# Patient Record
Sex: Female | Born: 1975 | Race: Black or African American | Hispanic: No | Smoking: Current some day smoker
Health system: Southern US, Community
[De-identification: ages and names within clinical notes are randomized; demographics above are authoritative.]

## PROBLEM LIST (undated history)

## (undated) DIAGNOSIS — F1010 Alcohol abuse, uncomplicated: Secondary | ICD-10-CM

## (undated) DIAGNOSIS — F4320 Adjustment disorder, unspecified: Secondary | ICD-10-CM

## (undated) DIAGNOSIS — Z789 Other specified health status: Secondary | ICD-10-CM

## (undated) DIAGNOSIS — F1994 Other psychoactive substance use, unspecified with psychoactive substance-induced mood disorder: Secondary | ICD-10-CM

## (undated) DIAGNOSIS — F410 Panic disorder [episodic paroxysmal anxiety] without agoraphobia: Secondary | ICD-10-CM

## (undated) HISTORY — PX: NO PAST SURGERIES: SHX2092

---

## 1997-05-14 ENCOUNTER — Ambulatory Visit (HOSPITAL_COMMUNITY): Admission: RE | Admit: 1997-05-14 | Discharge: 1997-05-14 | Payer: Self-pay | Admitting: *Deleted

## 1997-06-18 ENCOUNTER — Inpatient Hospital Stay (HOSPITAL_COMMUNITY): Admission: AD | Admit: 1997-06-18 | Discharge: 1997-06-18 | Payer: Self-pay | Admitting: *Deleted

## 1997-07-09 ENCOUNTER — Inpatient Hospital Stay (HOSPITAL_COMMUNITY): Admission: AD | Admit: 1997-07-09 | Discharge: 1997-07-09 | Payer: Self-pay | Admitting: Obstetrics

## 1997-07-10 ENCOUNTER — Inpatient Hospital Stay (HOSPITAL_COMMUNITY): Admission: AD | Admit: 1997-07-10 | Discharge: 1997-07-10 | Payer: Self-pay | Admitting: *Deleted

## 1997-09-19 ENCOUNTER — Inpatient Hospital Stay (HOSPITAL_COMMUNITY): Admission: AD | Admit: 1997-09-19 | Discharge: 1997-09-21 | Payer: Self-pay | Admitting: Obstetrics

## 2000-01-15 ENCOUNTER — Emergency Department (HOSPITAL_COMMUNITY): Admission: EM | Admit: 2000-01-15 | Discharge: 2000-01-15 | Payer: Self-pay | Admitting: Emergency Medicine

## 2005-09-04 ENCOUNTER — Inpatient Hospital Stay (HOSPITAL_COMMUNITY): Admission: AD | Admit: 2005-09-04 | Discharge: 2005-09-05 | Payer: Self-pay | Admitting: Obstetrics and Gynecology

## 2005-09-04 ENCOUNTER — Ambulatory Visit: Payer: Self-pay | Admitting: Certified Nurse Midwife

## 2008-08-09 ENCOUNTER — Emergency Department (HOSPITAL_COMMUNITY): Admission: EM | Admit: 2008-08-09 | Discharge: 2008-08-09 | Payer: Self-pay | Admitting: Emergency Medicine

## 2009-02-12 ENCOUNTER — Emergency Department (HOSPITAL_COMMUNITY): Admission: EM | Admit: 2009-02-12 | Discharge: 2009-02-12 | Payer: Self-pay | Admitting: Emergency Medicine

## 2016-01-13 NOTE — L&D Delivery Note (Signed)
Delivery Note At 12:40 AM a viable female was delivered via Vaginal, Spontaneous (Presentation: ROA).  APGAR: 8, 9; weight pending.   Placenta status: Delivered intact with gentle traction. 3 vessels cord:  with the following complications: None.  Cord pH: Not collected  Anesthesia:  Epidural Episiotomy: None Lacerations: None Suture Repair: N/A Est. Blood Loss (mL): 150  Mom to postpartum.  Baby to Couplet care / Skin to Skin.  Lovena NeighboursAbdoulaye Diallo, MD 12/03/2016, 12:57 AM  I confirm that I have verified the information documented in the resident's note and that I have also personally reperformed the physical exam and all medical decision making activities.  I was gloved and present for entire delivery SVD without incident No difficulty with shoulders No lacerations  Aviva SignsMarie L Rakia Frayne, CNM

## 2016-06-18 ENCOUNTER — Other Ambulatory Visit (HOSPITAL_COMMUNITY): Payer: Self-pay | Admitting: Nurse Practitioner

## 2016-06-18 DIAGNOSIS — O09522 Supervision of elderly multigravida, second trimester: Secondary | ICD-10-CM

## 2016-06-18 DIAGNOSIS — O26843 Uterine size-date discrepancy, third trimester: Secondary | ICD-10-CM

## 2016-06-18 DIAGNOSIS — Z3689 Encounter for other specified antenatal screening: Secondary | ICD-10-CM

## 2016-06-18 LAB — OB RESULTS CONSOLE ANTIBODY SCREEN: Antibody Screen: NEGATIVE

## 2016-06-18 LAB — OB RESULTS CONSOLE RPR: RPR: NONREACTIVE

## 2016-06-18 LAB — OB RESULTS CONSOLE ABO/RH: RH Type: POSITIVE

## 2016-06-18 LAB — OB RESULTS CONSOLE HEPATITIS B SURFACE ANTIGEN: HEP B S AG: NEGATIVE

## 2016-06-18 LAB — OB RESULTS CONSOLE GC/CHLAMYDIA
Chlamydia: NEGATIVE
Gonorrhea: NEGATIVE

## 2016-06-18 LAB — OB RESULTS CONSOLE RUBELLA ANTIBODY, IGM: RUBELLA: IMMUNE

## 2016-06-18 LAB — OB RESULTS CONSOLE HIV ANTIBODY (ROUTINE TESTING): HIV: NONREACTIVE

## 2016-06-29 ENCOUNTER — Encounter (HOSPITAL_COMMUNITY): Payer: Self-pay | Admitting: *Deleted

## 2016-07-02 ENCOUNTER — Encounter (HOSPITAL_COMMUNITY): Payer: Self-pay

## 2016-07-02 ENCOUNTER — Ambulatory Visit (HOSPITAL_COMMUNITY)
Admission: RE | Admit: 2016-07-02 | Discharge: 2016-07-02 | Disposition: A | Discharge status: Home / Self Care | Payer: Self-pay | Source: Ambulatory Visit | Attending: Nurse Practitioner | Admitting: Nurse Practitioner

## 2016-07-02 DIAGNOSIS — Z3A18 18 weeks gestation of pregnancy: Secondary | ICD-10-CM | POA: Insufficient documentation

## 2016-07-02 DIAGNOSIS — O26843 Uterine size-date discrepancy, third trimester: Secondary | ICD-10-CM

## 2016-07-02 DIAGNOSIS — O321XX0 Maternal care for breech presentation, not applicable or unspecified: Secondary | ICD-10-CM | POA: Insufficient documentation

## 2016-07-02 DIAGNOSIS — O350XX0 Maternal care for (suspected) central nervous system malformation in fetus, not applicable or unspecified: Secondary | ICD-10-CM | POA: Insufficient documentation

## 2016-07-02 DIAGNOSIS — Z3689 Encounter for other specified antenatal screening: Secondary | ICD-10-CM

## 2016-07-02 DIAGNOSIS — G930 Cerebral cysts: Secondary | ICD-10-CM | POA: Insufficient documentation

## 2016-07-02 DIAGNOSIS — O09522 Supervision of elderly multigravida, second trimester: Secondary | ICD-10-CM

## 2016-07-02 HISTORY — DX: Other specified health status: Z78.9

## 2016-07-02 NOTE — Addendum Note (Signed)
Encounter addended by: Levonne HubertStalter, Rainah Kirshner M on: 07/02/2016  3:48 PM<BR>    Actions taken: Imaging Exam ended

## 2016-07-29 ENCOUNTER — Other Ambulatory Visit (HOSPITAL_COMMUNITY): Payer: Self-pay

## 2016-11-05 LAB — OB RESULTS CONSOLE GBS: GBS: POSITIVE

## 2016-12-02 ENCOUNTER — Inpatient Hospital Stay (HOSPITAL_COMMUNITY)
Admission: RE | Admit: 2016-12-02 | Discharge: 2016-12-04 | DRG: 807 | Disposition: A | Discharge status: Home / Self Care | Payer: Medicaid Other | Source: Ambulatory Visit | Attending: Family Medicine | Admitting: Family Medicine

## 2016-12-02 ENCOUNTER — Inpatient Hospital Stay (HOSPITAL_COMMUNITY): Payer: Medicaid Other | Admitting: Anesthesiology

## 2016-12-02 ENCOUNTER — Encounter (HOSPITAL_COMMUNITY): Payer: Self-pay | Admitting: Anesthesiology

## 2016-12-02 ENCOUNTER — Encounter (HOSPITAL_COMMUNITY): Payer: Self-pay

## 2016-12-02 DIAGNOSIS — O09523 Supervision of elderly multigravida, third trimester: Secondary | ICD-10-CM | POA: Diagnosis present

## 2016-12-02 DIAGNOSIS — O26893 Other specified pregnancy related conditions, third trimester: Principal | ICD-10-CM | POA: Diagnosis present

## 2016-12-02 DIAGNOSIS — O99824 Streptococcus B carrier state complicating childbirth: Secondary | ICD-10-CM | POA: Diagnosis present

## 2016-12-02 DIAGNOSIS — Z3A40 40 weeks gestation of pregnancy: Secondary | ICD-10-CM | POA: Diagnosis not present

## 2016-12-02 LAB — CBC
HEMATOCRIT: 35.2 % — AB (ref 36.0–46.0)
Hemoglobin: 11.9 g/dL — ABNORMAL LOW (ref 12.0–15.0)
MCH: 33.1 pg (ref 26.0–34.0)
MCHC: 33.8 g/dL (ref 30.0–36.0)
MCV: 97.8 fL (ref 78.0–100.0)
PLATELETS: 281 10*3/uL (ref 150–400)
RBC: 3.6 MIL/uL — ABNORMAL LOW (ref 3.87–5.11)
RDW: 12.7 % (ref 11.5–15.5)
WBC: 8 10*3/uL (ref 4.0–10.5)

## 2016-12-02 LAB — TYPE AND SCREEN
ABO/RH(D): O POS
Antibody Screen: NEGATIVE

## 2016-12-02 LAB — RPR: RPR Ser Ql: NONREACTIVE

## 2016-12-02 MED ORDER — DEXTROSE 5 % IV SOLN
5.0000 10*6.[IU] | Freq: Once | INTRAVENOUS | Status: AC
  Administered 2016-12-02: 5 10*6.[IU] via INTRAVENOUS
  Filled 2016-12-02: qty 5

## 2016-12-02 MED ORDER — FLEET ENEMA 7-19 GM/118ML RE ENEM: 1.0000 | ENEMA | RECTAL | Status: DC | PRN

## 2016-12-02 MED ORDER — EPHEDRINE 5 MG/ML INJ: 10.0000 mg | INTRAVENOUS | Status: DC | PRN

## 2016-12-02 MED ORDER — MISOPROSTOL 50MCG HALF TABLET
50.0000 ug | ORAL_TABLET | ORAL | Status: DC
  Administered 2016-12-02: 50 ug via ORAL
  Filled 2016-12-02 (×2): qty 1

## 2016-12-02 MED ORDER — EPHEDRINE 5 MG/ML INJ
10.0000 mg | INTRAVENOUS | Status: DC | PRN
  Filled 2016-12-02: qty 2

## 2016-12-02 MED ORDER — OXYCODONE-ACETAMINOPHEN 5-325 MG PO TABS: 1.0000 | ORAL_TABLET | ORAL | Status: DC | PRN

## 2016-12-02 MED ORDER — ONDANSETRON HCL 4 MG/2ML IJ SOLN: 4.0000 mg | Freq: Four times a day (QID) | INTRAMUSCULAR | Status: DC | PRN

## 2016-12-02 MED ORDER — SOD CITRATE-CITRIC ACID 500-334 MG/5ML PO SOLN: 30.0000 mL | ORAL | Status: DC | PRN

## 2016-12-02 MED ORDER — LACTATED RINGERS IV SOLN: 500.0000 mL | Freq: Once | INTRAVENOUS | Status: DC

## 2016-12-02 MED ORDER — ACETAMINOPHEN 325 MG PO TABS: 650.0000 mg | ORAL_TABLET | ORAL | Status: DC | PRN

## 2016-12-02 MED ORDER — PHENYLEPHRINE 40 MCG/ML (10ML) SYRINGE FOR IV PUSH (FOR BLOOD PRESSURE SUPPORT)
80.0000 ug | PREFILLED_SYRINGE | INTRAVENOUS | Status: DC | PRN
  Filled 2016-12-02: qty 5

## 2016-12-02 MED ORDER — PHENYLEPHRINE 40 MCG/ML (10ML) SYRINGE FOR IV PUSH (FOR BLOOD PRESSURE SUPPORT): 80.0000 ug | PREFILLED_SYRINGE | INTRAVENOUS | Status: DC | PRN

## 2016-12-02 MED ORDER — PHENYLEPHRINE 40 MCG/ML (10ML) SYRINGE FOR IV PUSH (FOR BLOOD PRESSURE SUPPORT)
80.0000 ug | PREFILLED_SYRINGE | INTRAVENOUS | Status: DC | PRN
  Administered 2016-12-02 (×2): 80 ug via INTRAVENOUS
  Filled 2016-12-02: qty 5

## 2016-12-02 MED ORDER — FENTANYL 2.5 MCG/ML BUPIVACAINE 1/10 % EPIDURAL INFUSION (WH - ANES)
INTRAMUSCULAR | Status: AC
  Filled 2016-12-02: qty 100

## 2016-12-02 MED ORDER — OXYTOCIN 40 UNITS IN LACTATED RINGERS INFUSION - SIMPLE MED
1.0000 m[IU]/min | INTRAVENOUS | Status: DC
  Administered 2016-12-02: 1 m[IU]/min via INTRAVENOUS

## 2016-12-02 MED ORDER — LIDOCAINE HCL (PF) 1 % IJ SOLN
30.0000 mL | INTRAMUSCULAR | Status: DC | PRN
  Filled 2016-12-02: qty 30

## 2016-12-02 MED ORDER — PHENYLEPHRINE 40 MCG/ML (10ML) SYRINGE FOR IV PUSH (FOR BLOOD PRESSURE SUPPORT)
PREFILLED_SYRINGE | INTRAVENOUS | Status: AC
  Administered 2016-12-02: 80 ug via INTRAVENOUS
  Filled 2016-12-02: qty 20

## 2016-12-02 MED ORDER — OXYTOCIN BOLUS FROM INFUSION
500.0000 mL | Freq: Once | INTRAVENOUS | Status: AC
  Administered 2016-12-03: 500 mL via INTRAVENOUS

## 2016-12-02 MED ORDER — TERBUTALINE SULFATE 1 MG/ML IJ SOLN
0.2500 mg | Freq: Once | INTRAMUSCULAR | Status: DC | PRN
  Filled 2016-12-02: qty 1

## 2016-12-02 MED ORDER — LIDOCAINE HCL (PF) 1 % IJ SOLN
INTRAMUSCULAR | Status: DC | PRN
  Administered 2016-12-02 (×2): 5 mL via EPIDURAL

## 2016-12-02 MED ORDER — LACTATED RINGERS IV SOLN
500.0000 mL | Freq: Once | INTRAVENOUS | Status: AC
  Administered 2016-12-02: 500 mL via INTRAVENOUS

## 2016-12-02 MED ORDER — OXYTOCIN 40 UNITS IN LACTATED RINGERS INFUSION - SIMPLE MED
1.0000 m[IU]/min | INTRAVENOUS | Status: DC
  Filled 2016-12-02: qty 1000

## 2016-12-02 MED ORDER — FENTANYL CITRATE (PF) 100 MCG/2ML IJ SOLN
100.0000 ug | INTRAMUSCULAR | Status: DC | PRN
  Administered 2016-12-02: 100 ug via INTRAVENOUS
  Filled 2016-12-02: qty 2

## 2016-12-02 MED ORDER — OXYTOCIN 40 UNITS IN LACTATED RINGERS INFUSION - SIMPLE MED
2.5000 [IU]/h | INTRAVENOUS | Status: DC
  Administered 2016-12-03: 2.5 [IU]/h via INTRAVENOUS

## 2016-12-02 MED ORDER — HYDROXYZINE HCL 50 MG PO TABS
50.0000 mg | ORAL_TABLET | Freq: Four times a day (QID) | ORAL | Status: DC | PRN
  Filled 2016-12-02: qty 1

## 2016-12-02 MED ORDER — DIPHENHYDRAMINE HCL 50 MG/ML IJ SOLN: 12.5000 mg | INTRAMUSCULAR | Status: DC | PRN

## 2016-12-02 MED ORDER — LACTATED RINGERS IV SOLN
500.0000 mL | INTRAVENOUS | Status: DC | PRN
  Administered 2016-12-02 (×2): 500 mL via INTRAVENOUS

## 2016-12-02 MED ORDER — FENTANYL CITRATE (PF) 100 MCG/2ML IJ SOLN: 50.0000 ug | INTRAMUSCULAR | Status: DC | PRN

## 2016-12-02 MED ORDER — PENICILLIN G POT IN DEXTROSE 60000 UNIT/ML IV SOLN
3.0000 10*6.[IU] | INTRAVENOUS | Status: DC
  Administered 2016-12-02 – 2016-12-03 (×4): 3 10*6.[IU] via INTRAVENOUS
  Filled 2016-12-02 (×7): qty 50

## 2016-12-02 MED ORDER — OXYCODONE-ACETAMINOPHEN 5-325 MG PO TABS: 2.0000 | ORAL_TABLET | ORAL | Status: DC | PRN

## 2016-12-02 MED ORDER — FENTANYL 2.5 MCG/ML BUPIVACAINE 1/10 % EPIDURAL INFUSION (WH - ANES)
14.0000 mL/h | INTRAMUSCULAR | Status: DC | PRN
  Administered 2016-12-02 (×2): 14 mL/h via EPIDURAL

## 2016-12-02 MED ORDER — LACTATED RINGERS IV SOLN
INTRAVENOUS | Status: DC
  Administered 2016-12-02 (×4): via INTRAVENOUS

## 2016-12-02 NOTE — Progress Notes (Signed)
Patient ID: Veronica Le, female   DOB: 09/10/1975, 41 y.o.   MRN: [id number] Vitals:   12/02/16 1958 12/02/16 2001 12/02/16 2031 12/02/16 2101  BP: 112/64 107/72 116/88 (!) 115/50  Pulse: 73 95  81  Resp:      Temp: (!) 97.2 F (36.2 C)     TempSrc: Oral     SpO2:      Weight:      Height:       FHR stable with average variability and small early decels UCs every 2 min On Pitocin at 631mu/min  Dilation: 6.5 Effacement (%): 70 Cervical Position: Posterior, Middle Station: -2 Presentation: Vertex Exam by:: Sandy SalaamLynnsey Johnson, RN   Anticipate SVD

## 2016-12-02 NOTE — Anesthesia Preprocedure Evaluation (Signed)

## 2016-12-02 NOTE — H&P (Signed)
LABOR AND DELIVERY ADMISSION HISTORY AND PHYSICAL NOTE  Veronica Le is a 41 y.o. female [phone] with IUP at 6958w1d by 20wk US presenting for IOL for AMA.   She reports positive fetal movement. She denies leakage of fluid or vaginal bleeding.  Prenatal History/Complications:  Past Medical History: Past Medical History:  Diagnosis Date  . Medical history non-contributory     Past Surgical History: Past Surgical History:  Procedure Laterality Date  . NO PAST SURGERIES      Obstetrical History: OB History    Gravida Para Term Preterm AB Living   9 3 3   5 3    SAB TAB Ectopic Multiple Live Births     5            Social History: Social History   Socioeconomic History  . Marital status: Married    Spouse name: None  . Number of children: None  . Years of education: None  . Highest education level: None  Social Needs  . Financial resource strain: None  . Food insecurity - worry: None  . Food insecurity - inability: None  . Transportation needs - medical: None  . Transportation needs - non-medical: None  Occupational History  . None  Tobacco Use  . Smoking status: Never Smoker  . Smokeless tobacco: Never Used  Substance and Sexual Activity  . Alcohol use: No  . Drug use: No  . Sexual activity: Yes    Birth control/protection: None  Other Topics Concern  . None  Social History Narrative  . None    Family History: No family history on file.  Allergies: No Known Allergies  Medications Prior to Admission  Medication Sig Dispense Refill Last Dose  . Prenatal Vit w/Fe-Methylfol-FA (PNV PO) Take by mouth.   Taking     Review of Systems   All systems reviewed and negative except as stated in HPI  Last menstrual period 03/09/2016. General appearance: alert, cooperative and no distress Lungs: clear to auscultation bilaterally Heart: regular rate and rhythm Abdomen: soft, non-tender; bowel sounds normal Extremities: No calf swelling or  tenderness Presentation: cephalic Dilation: 1 Effacement (%): 50 Cervical Position: Posterior Station: -3 Presentation: Vertex Exam by:: Dr. Adrian BlackwaterStinson  Fetal monitoring: 130s, + accels, mod variability Uterine activity: irregular     Prenatal labs: ABO, Rh:  O+ Antibody:  neg Rubella:  immune RPR:   neg HBsAg:   neg HIV:   nonreactive GBS:   positive 1 hr Glucola: normal Genetic screening:  declined Anatomy US: normal  Prenatal Transfer Tool  Maternal Diabetes: No Genetic Screening: Declined Maternal Ultrasounds/Referrals: Normal Fetal Ultrasounds or other Referrals:  None Maternal Substance Abuse:  No Significant Maternal Medications:  None Significant Maternal Lab Results: Lab values include: Group B Strep positive  No results found for this or any previous visit (from the past 24 hour(s)).  There are no active problems to display for this patient.   Assessment: Veronica Le is a 41 y.o. [id number] at 5658w1d here for IOL for AMA  #Labor: IOL with cytotec PO. Foley balloon when able. #Pain: fentanyl #ID:  PCN #MOF: breast #MOC:POP #Circ:  N/A  Levie HeritageJacob J Raphael Espe, DO  12/02/2016, 7:44 AM

## 2016-12-02 NOTE — Anesthesia Procedure Notes (Addendum)
Epidural Patient location during procedure: OB Start time: 12/02/2016 6:52 PM End time: 12/02/2016 6:57 PM  Staffing Anesthesiologist: Bethena Midgetddono, Shaquel Josephson, MD  Preanesthetic Checklist Completed: patient identified, site marked, surgical consent, pre-op evaluation, timeout performed, IV checked, risks and benefits discussed and monitors and equipment checked  Epidural Patient position: sitting Prep: site prepped and draped and DuraPrep Patient monitoring: continuous pulse ox and blood pressure Approach: midline Location: L4-L5 Injection technique: LOR air  Needle:  Needle type: Tuohy  Needle gauge: 17 G Needle length: 9 cm and 9 Needle insertion depth: 5 cm cm Catheter type: closed end flexible Catheter size: 19 Gauge Catheter at skin depth: 10 cm Test dose: negative  Assessment Events: blood not aspirated, injection not painful, no injection resistance, negative IV test and no paresthesia

## 2016-12-02 NOTE — Progress Notes (Signed)
LABOR PROGRESS NOTE  Veronica Le is a 41 y.o. [id number] at 3335w1d  admitted for IOL for AMA.  Subjective: Patient doing well. Denies any concerns.  Objective: BP 125/67   Pulse 82   Temp 98.3 F (36.8 C) (Oral)   Resp 18   Ht 5\' 6"  (1.676 m)   Wt 218 lb (98.9 kg)   LMP 03/09/2016 (Approximate)   BMI 35.19 kg/m  or  Vitals:   12/02/16 0743 12/02/16 0930 12/02/16 1030 12/02/16 1210  BP: 131/71   125/67  Pulse: 88   82  Resp: 18 18 16 18   Temp: 99 F (37.2 C)   98.3 F (36.8 C)  TempSrc: Oral   Oral  Weight: 218 lb (98.9 kg)     Height: 5\' 6"  (1.676 m)       SVE: Dilation: 2.5 Effacement (%): 30 Cervical Position: Posterior Station: -3 Presentation: Vertex Exam by:: Dr. Nira Retortegele FHT: baseline rate 145, moderate varibility, +acel, occasional variable decels Toco: ctx q2-4 min  Assessment / Plan: 41 y.o. [id number] at 8835w1d here for IOL for AMA  Labor: s/p cytotec x 1, now contracting and with occasional variables. FB placed. Will start Pitocin 1 x 1 Fetal Wellbeing:  Cat II, continue to monitor closely Pain Control:  Supportive. Not planning on epdiural Anticipated MOD:  SVD  Frederik PearJulie P Jaben Benegas, MD 12/02/2016, 1:13 PM

## 2016-12-02 NOTE — Anesthesia Pain Management Evaluation Note (Signed)
  CRNA Pain Management Visit Note  Patient: Veronica Le, 41 y.o., female  "Hello I am a [facility] the anesthesia [facility]. We have an anesthesia team available at all times to provide care throughout the hospital, including epidural management and anesthesia for C-section. I don't know your plan for the delivery whether it a natural birth, water birth, IV sedation, nitrous supplementation, doula or epidural, but we want to meet your pain goals."   1.Was your pain managed to your expectations on prior hospitalizations?   Yes   2.What is your expectation for pain management during this hospitalization?     Labor support without medications  3.How can we help you reach that goal? Be available;pt states she doesn't want intervention for pain and has had natural labor and delivery in the past with satisfaction  Record the patient's initial score and the patient's pain goal.   Pain: 0  Pain Goal: 10 [facility] wants you to be able to say your pain was always managed very well.  Edison PaceWILKERSON,Naphtali Zywicki 12/02/2016

## 2016-12-02 NOTE — Progress Notes (Signed)
LABOR PROGRESS NOTE  Veronica Le is a 41 y.o. [id number] at 8476w1d  admitted for IOL for AMA.  Subjective: Patient feeling strong contractions now. Does not want anything for pain.  Per RN, SROM at 1800 with clear fluid.  Objective: BP 132/62   Pulse 78   Temp 98.4 F (36.9 C) (Oral)   Resp 18   Ht 5\' 6"  (1.676 m)   Wt 218 lb (98.9 kg)   LMP 03/09/2016 (Approximate)   BMI 35.19 kg/m  or  Vitals:   12/02/16 1509 12/02/16 1550 12/02/16 1640 12/02/16 1800  BP:  125/74  132/62  Pulse:  81  78  Resp: 18 20 18    Temp:  (!) 97.5 F (36.4 C)  98.4 F (36.9 C)  TempSrc:  Oral  Oral  Weight:      Height:        Last SVE: Dilation: 2.5 Effacement (%): 30 Cervical Position: Posterior Station: -3 Presentation: Vertex Exam by:: Dr. Nira Retortegele   FB remains in place  FHT: baseline rate 140, moderate varibility, +acel, occasional variable decels Toco: ctx q2-4 min  Assessment / Plan: 41 y.o. Veronica Le at 2176w1d here for IOL for AMA  Labor: s/p cytotec x 1, and FB (placed at 1300). On 451mU/min of Pitocin contracting regularly. Continue current management for now Fetal Wellbeing:  Cat II with occasional variable, bu mod variability and +acel Pain Control:  Supportive. Not planning on epdiural Anticipated MOD:  SVD  Frederik PearJulie P Calyn Rubi, MD 12/02/2016, 6:18 PM

## 2016-12-03 ENCOUNTER — Encounter (HOSPITAL_COMMUNITY): Payer: Self-pay

## 2016-12-03 DIAGNOSIS — Z3A40 40 weeks gestation of pregnancy: Secondary | ICD-10-CM

## 2016-12-03 MED ORDER — DIPHENHYDRAMINE HCL 25 MG PO CAPS
25.0000 mg | ORAL_CAPSULE | Freq: Four times a day (QID) | ORAL | Status: DC | PRN
Start: 2016-12-03 — End: 2016-12-04

## 2016-12-03 MED ORDER — TETANUS-DIPHTH-ACELL PERTUSSIS 5-2.5-18.5 LF-MCG/0.5 IM SUSP: 0.5000 mL | Freq: Once | INTRAMUSCULAR | Status: DC

## 2016-12-03 MED ORDER — ONDANSETRON HCL 4 MG PO TABS: 4.0000 mg | ORAL_TABLET | ORAL | Status: DC | PRN

## 2016-12-03 MED ORDER — WITCH HAZEL-GLYCERIN EX PADS: 1.0000 "application " | MEDICATED_PAD | CUTANEOUS | Status: DC | PRN

## 2016-12-03 MED ORDER — BENZOCAINE-MENTHOL 20-0.5 % EX AERO: 1.0000 "application " | INHALATION_SPRAY | CUTANEOUS | Status: DC | PRN

## 2016-12-03 MED ORDER — ZOLPIDEM TARTRATE 5 MG PO TABS: 5.0000 mg | ORAL_TABLET | Freq: Every evening | ORAL | Status: DC | PRN

## 2016-12-03 MED ORDER — SIMETHICONE 80 MG PO CHEW: 80.0000 mg | CHEWABLE_TABLET | ORAL | Status: DC | PRN

## 2016-12-03 MED ORDER — ONDANSETRON HCL 4 MG/2ML IJ SOLN: 4.0000 mg | INTRAMUSCULAR | Status: DC | PRN

## 2016-12-03 MED ORDER — ACETAMINOPHEN 325 MG PO TABS: 650.0000 mg | ORAL_TABLET | ORAL | Status: DC | PRN

## 2016-12-03 MED ORDER — COCONUT OIL OIL: 1.0000 "application " | TOPICAL_OIL | Status: DC | PRN

## 2016-12-03 MED ORDER — DIBUCAINE 1 % RE OINT: 1.0000 "application " | TOPICAL_OINTMENT | RECTAL | Status: DC | PRN

## 2016-12-03 MED ORDER — PRENATAL MULTIVITAMIN CH
1.0000 | ORAL_TABLET | Freq: Every day | ORAL | Status: DC
  Administered 2016-12-03 – 2016-12-04 (×2): 1 via ORAL
  Filled 2016-12-03 (×2): qty 1

## 2016-12-03 MED ORDER — IBUPROFEN 600 MG PO TABS
600.0000 mg | ORAL_TABLET | Freq: Four times a day (QID) | ORAL | Status: DC
  Administered 2016-12-03 – 2016-12-04 (×6): 600 mg via ORAL
  Filled 2016-12-03 (×6): qty 1

## 2016-12-03 MED ORDER — SENNOSIDES-DOCUSATE SODIUM 8.6-50 MG PO TABS
2.0000 | ORAL_TABLET | ORAL | Status: DC
  Administered 2016-12-03: 2 via ORAL
  Filled 2016-12-03: qty 2

## 2016-12-03 NOTE — Lactation Note (Signed)
This note was copied from a baby's chart. Lactation Consultation Note  Patient Name: Girl Chrisa Rogstad Today's Date: 12/03/2016 Reason for consult: Follow-up assessment;Difficult latch  Baby 15 hours old. Lupita Leashonna, RN in the room assessing baby's respiratory rate and temperature, and had assisted mom with hand expressing and latching the baby. Baby latched deeply but sleepy at breast and nursing off-and-on. Mom touching and stimulating baby to continue suckling. Enc mom to offer lots of STS and breast with cues. Mom reports that she nursed her older children 1 year each.   Maternal Data Has patient been taught Hand Expression?: Yes  Feeding Feeding Type: Breast Fed Length of feed: 10 min(per mom )  LATCH Score                   Interventions Interventions: Breast feeding basics reviewed  Lactation Tools Discussed/Used     Consult Status Consult Status: Follow-up Date: 12/04/16 Follow-up type: In-patient    Sherlyn HayJennifer D Donovon Micheletti 12/03/2016, 4:28 PM

## 2016-12-03 NOTE — Lactation Note (Signed)
This note was copied from a baby's chart. Lactation Consultation Note  Patient Name: Veronica Le Today's Date: 12/03/2016 Reason for consult: Initial assessment;Term(mom eating lunch and permom fed at 1:30 for 10 mins , LC enc to call for feeding assessment with cues )  Baby is 13 hours old  LC reviewed doc flow sheets - WNL for age and updated per mom  Per mom baby recently fed at 1:30 for 10 mins  LC encouraged mom to call with feeding cues for latch assessment  Mother informed of post-discharge support and given phone number to the lactation department, including services for phone call assistance; out-patient appointments; and breastfeeding support group. List of other breastfeeding resources in the community given in the handout. Encouraged mother to call for problems or concerns related to breastfeeding.   Maternal Data    Feeding Feeding Type: Breast Fed Length of feed: 10 min(per mom )  LATCH Score                   Interventions Interventions: Breast feeding basics reviewed  Lactation Tools Discussed/Used     Consult Status Consult Status: Follow-up Date: 12/03/16 Follow-up type: In-patient    Matilde SprangMargaret Ann Tasia Liz 12/03/2016, 2:07 PM

## 2016-12-03 NOTE — Lactation Note (Signed)
This note was copied from a baby's chart. Lactation Consultation Note  Patient Name: Girl Emalie Kovatch Today's Date: 12/03/2016 Reason for consult: Follow-up assessment  Baby 17 hours old. Lupita Leashonna, RN [facility] d/t mom being set up with DEBP. Mom had baby latched to right breast in cross-cradle position and baby suckling rhythmically with a few swallows noted. Mom reports that she did not see much colostrum when she pumped, but she did hand express a few drops into the baby's mouth. Enc mom to keep pumping every 2-3 hours since baby suckling off-and-on. Discussed pumping for stimulation and hand expressing to obtain colostrum to give directly to baby.   Maternal Data Has patient been taught Hand Expression?: Yes  Feeding Feeding Type: Breast Fed  LATCH Score Latch: Grasps breast easily, tongue down, lips flanged, rhythmical sucking.  Audible Swallowing: A few with stimulation  Type of Nipple: Everted at rest and after stimulation  Comfort (Breast/Nipple): Soft / non-tender  Hold (Positioning): No assistance needed to correctly position infant at breast.  LATCH Score: 9  Interventions    Lactation Tools Discussed/Used Tools: Pump Breast pump type: Double-Electric Breast Pump Pump Review: Setup, frequency, and cleaning Initiated by:: Milagros Reaponna Esker, RN Date initiated:: 12/03/16   Consult Status Consult Status: Follow-up Date: 12/04/16 Follow-up type: In-patient    Sherlyn HayJennifer D Kloey Cazarez 12/03/2016, 6:06 PM

## 2016-12-03 NOTE — Anesthesia Postprocedure Evaluation (Signed)
Anesthesia Post Note  Patient: Veronica Le  Procedure(s) Performed: AN AD HOC LABOR EPIDURAL     Patient location during evaluation: Mother Baby Anesthesia Type: Epidural Level of consciousness: awake Pain management: satisfactory to patient Vital Signs Assessment: post-procedure vital signs reviewed and stable Respiratory status: spontaneous breathing Cardiovascular status: stable Anesthetic complications: no    Last Vitals:  Vitals:   12/03/16 0153 12/03/16 0220  BP:  (!) 122/57  Pulse:  89  Resp:  18  Temp: 37.2 C 37.7 C  SpO2:      Last Pain:  Vitals:   12/03/16 0220  TempSrc: Oral  PainSc: 0-No pain   Pain Goal:                 KeyCorpBURGER,Veronica Le

## 2016-12-04 NOTE — Discharge Summary (Signed)
[facility]  Patient name: Veronica Le Medical record number: [id number] Date of birth: 02/27/1975 Age: 41 y.o. Gender: female Date of Admission: 12/02/2016  Date of Discharge: 12/04/16 Admitting Physician: Willodean Rosenthalarolyn Harraway-Smith, MD  Primary Care Provider: Patient, No Pcp Per Consultants: OB  Indication for Hospitalization: SVD delivery  Discharge Diagnoses/Problem List:  Advanced maternal age SVC  Disposition: to home  Discharge Condition: stable  Discharge Exam: well appearing Card: RRR, no significant edema Pulm: CTAB, no wheezing, no IWB Fundus: firm, minimally tender  Brief Hospital Course:  Admitted for SVD with no significant complications.   Recovered appropriately and D/c's on PPD#1  Issues for Follow Up:  1. Post partum f/u with primary obgyn in 2wks  Significant Procedures: SVD  Significant Labs and Imaging:  Recent Labs  Lab 12/02/16 0824  WBC 8.0  HGB 11.9*  HCT 35.2*  PLT 281    No results found.  Results/Tests Pending at Time of Discharge: n/a  Discharge Medications:  Allergies as of 12/04/2016   No Known Allergies     Medication List    TAKE these medications   PNV PO Take 1 tablet by mouth daily.       Discharge Instructions: Please refer to Patient Instructions section of EMR for full details.  Patient was counseled important signs and symptoms that should prompt return to medical care, changes in medications, dietary instructions, activity restrictions, and follow up appointments.   Follow-Up Appointments:   Marthenia RollingBland, Emil Klassen, DO 12/04/2016, 12:13 PM PGY-1, [facility]

## 2016-12-04 NOTE — Lactation Note (Signed)
This note was copied from a baby's chart. Lactation Consultation Note: Mother reports that her nipples are very sore and that she feels painful latch with each feeding. Infant was just fed for 25 mins. Less than an hour ago.  Assist mother with placing infant on the breast in cross cradle hold. Mother taught to latch with an off sided latch. Infant took a few sucks and then pushed off. Infant was placed in football hold and refused breast.  Lots of teaching with mother. Mother plans to use soy formula when at home to let father feed infant. Advised mother to post pump after each feeding for 15 mins to stimulate milk volume. Mother is able to hand express colostrum well.  Observed nipples and do not see any cracking. Mother advised to get coconut oil from staff nurse to use as needed. Encouraged mother to rotate positions frequently and use good breast support with hand and pillows. Mother advised in treatment and prevention of engorgment. Mother reports that she has been certified with [employer]IC in the past but doesn't have an appt scheduled at this time. [facility]. Mother has a hand pump and was advised to use as needed. Discussed cue base feeding and encouraged mother to breastfeed infant 8-12 times in 24 hours. Mother is aware of available LC services and community support.   Patient Name: Veronica Le Reason for consult: Follow-up assessment   Maternal Data    Feeding Feeding Type: Breast Fed Length of feed: 25 min  LATCH Score                   Interventions    Lactation Tools Discussed/Used     Consult Status Consult Status: Complete    Veronica BickersKendrick, Veronica Le Veronica Le, 10:19 AM

## 2017-08-02 ENCOUNTER — Encounter (HOSPITAL_COMMUNITY): Payer: Self-pay | Admitting: Emergency Medicine

## 2017-08-02 ENCOUNTER — Emergency Department (HOSPITAL_COMMUNITY)
Admission: EM | Admit: 2017-08-02 | Discharge: 2017-08-02 | Disposition: A | Discharge status: Home / Self Care | Payer: Self-pay | Attending: Emergency Medicine | Admitting: Emergency Medicine

## 2017-08-02 ENCOUNTER — Other Ambulatory Visit: Payer: Self-pay

## 2017-08-02 DIAGNOSIS — R110 Nausea: Secondary | ICD-10-CM | POA: Insufficient documentation

## 2017-08-02 DIAGNOSIS — K0889 Other specified disorders of teeth and supporting structures: Secondary | ICD-10-CM | POA: Insufficient documentation

## 2017-08-02 MED ORDER — PENICILLIN V POTASSIUM 500 MG PO TABS: 500.0000 mg | ORAL_TABLET | Freq: Four times a day (QID) | ORAL | 0 refills | Status: AC

## 2017-08-02 MED ORDER — BENZOCAINE 10 % MT GEL: 1.0000 "application " | OROMUCOSAL | 0 refills | Status: DC | PRN

## 2017-08-02 NOTE — ED Triage Notes (Signed)
Pt states she has had off and on dental pain with no fever for a while now. Feels like she has taken all of the ibuprofen and tylenol she can with no relief. Pt endorses nausea.

## 2017-08-02 NOTE — ED Provider Notes (Signed)
[facility]ENC[facility] Provider Note   CSN: [id number] Arrival date & time: 08/02/17  1946     History   Chief Complaint Chief Complaint  Patient presents with  . Dental Pain    HPI Danyah Elwell is a 42 y.o. female.  Leshia Mcconaha is a 42 y.o. Female who is otherwise healthy, presents to the ED for evaluation of dental pain.  She reports for the past 3 days she has had worsening pain over the right upper molars where she has a 4 broken teeth, she has had issues with these teeth in the past, but is never gotten this bad.  She denies any fevers.  She reports she is been taking a lot of ibuprofen and Tylenol without relief has not tried anything else to treat her symptoms.  She has had some mild nausea with no episodes of vomiting.  She reports her face feels a little bit swollen and puffy but she has had no difficulty swallowing or breathing, no pain or swelling under the tongue.     Past Medical History:  Diagnosis Date  . Medical history non-contributory     Patient Active Problem List   Diagnosis Date Noted  . AMA (advanced maternal age) multigravida 35+, third trimester 12/02/2016    Past Surgical History:  Procedure Laterality Date  . NO PAST SURGERIES       OB History    Gravida  9   Para  4   Term  4   Preterm      AB  5   Living  4     SAB      TAB  5   Ectopic      Multiple  0   Live Births  1            Home Medications    Prior to Admission medications   Medication Sig Start Date End Date Taking? Authorizing Provider  Prenatal Vit w/Fe-Methylfol-FA (PNV PO) Take 1 tablet by mouth daily.     [provider]    Family History No family history on file.  Social History Social History   Tobacco Use  . Smoking status: Never Smoker  . Smokeless tobacco: Never Used  Substance Use Topics  . Alcohol use: No  . Drug use: No     Allergies   Patient has no known allergies.   Review of  Systems Review of Systems  Constitutional: Negative for chills and fever.  HENT: Positive for dental problem. Negative for facial swelling, sore throat and trouble swallowing.   Respiratory: Negative for shortness of breath and stridor.   Gastrointestinal: Negative for nausea and vomiting.  Skin: Negative for color change and rash.     Physical Exam Updated Vital Signs BP 123/86 (BP Location: Right Arm)   Pulse 77   Temp 98.6 F (37 C) (Oral)   Resp 16   SpO2 100%   Physical Exam  Constitutional: She appears well-developed and well-nourished. No distress.  HENT:  Head: Normocephalic and atraumatic.  Teeth in general are in poor dentition, there are several broken and decayed teeth on the right upper molars, where pain is located, no obvious drainable abscess.  No sublingual tenderness or swelling, posterior oropharynx is clear and moist, uvula midline.  No trismus or torticollis  Eyes: Right eye exhibits no discharge. Left eye exhibits no discharge.  Neck: Normal range of motion. Neck supple.  No stridor  Pulmonary/Chest: Effort normal. No respiratory distress.  Neurological: She is alert. Coordination normal.  Skin: Skin is warm and dry. She is not diaphoretic.  Psychiatric: She has a normal mood and affect. Her behavior is normal.  Nursing note and vitals reviewed.    ED Treatments / Results  Labs (all labs ordered are listed, but only abnormal results are displayed) Labs Reviewed - No data to display  EKG None  Radiology No results found.  Procedures Procedures (including critical care time)  Medications Ordered in ED Medications - No data to display   Initial Impression / Assessment and Plan / ED Course  I have reviewed the triage vital signs and the nursing notes.  Pertinent labs & imaging results that were available during my care of the patient were reviewed by me and considered in my medical decision making (see chart for details).  Patient with  toothache.  No gross abscess.  Exam unconcerning for Ludwig's angina or spread of infection.  Will treat with penicillin and anti-inflammatories medicine.  Urged patient to follow-up with dentist. Dental resources provided.  Final Clinical Impressions(s) / ED Diagnoses   Final diagnoses:  Pain, dental    ED Discharge Orders        Ordered    penicillin v potassium (VEETID) 500 MG tablet  4 times daily     08/02/17 2122    benzocaine (ORAJEL) 10 % mucosal gel  As needed     08/02/17 2122       Dartha Lodge, PA-C 08/03/17 0042    Wynetta Fines, MD 08/04/17 [phone]

## 2017-08-02 NOTE — Discharge Instructions (Signed)
Please take entire course of antibiotics as directed.  You may use Orajel as needed for pain in addition to ibuprofen 600 mg every 6 hours, and Tylenol 650 mg every 6 hours.  You will need to follow-up with a dentist for further management.  Please use the dental resources provided.  Return to the emergency department for fevers, pain or swelling under the tongue, difficulty swallowing or breathing or any other new or concerning symptoms.

## 2019-11-01 ENCOUNTER — Emergency Department (HOSPITAL_COMMUNITY): Payer: Self-pay

## 2019-11-01 ENCOUNTER — Observation Stay (HOSPITAL_COMMUNITY)
Admission: EM | Admit: 2019-11-01 | Discharge: 2019-11-03 | Disposition: A | Discharge status: Home / Self Care | Payer: Self-pay | Attending: Orthopedic Surgery | Admitting: Orthopedic Surgery

## 2019-11-01 ENCOUNTER — Emergency Department (HOSPITAL_COMMUNITY): Payer: Self-pay | Admitting: Certified Registered Nurse Anesthetist

## 2019-11-01 ENCOUNTER — Encounter (HOSPITAL_COMMUNITY)
Admission: EM | Disposition: A | Discharge status: Home / Self Care | Payer: Self-pay | Source: Home / Self Care | Attending: Emergency Medicine

## 2019-11-01 ENCOUNTER — Observation Stay (HOSPITAL_COMMUNITY): Payer: Self-pay

## 2019-11-01 ENCOUNTER — Encounter (HOSPITAL_COMMUNITY): Payer: Self-pay | Admitting: Emergency Medicine

## 2019-11-01 DIAGNOSIS — Z419 Encounter for procedure for purposes other than remedying health state, unspecified: Secondary | ICD-10-CM

## 2019-11-01 DIAGNOSIS — W010XXA Fall on same level from slipping, tripping and stumbling without subsequent striking against object, initial encounter: Secondary | ICD-10-CM | POA: Insufficient documentation

## 2019-11-01 DIAGNOSIS — Z20822 Contact with and (suspected) exposure to covid-19: Secondary | ICD-10-CM | POA: Insufficient documentation

## 2019-11-01 DIAGNOSIS — S82842A Displaced bimalleolar fracture of left lower leg, initial encounter for closed fracture: Principal | ICD-10-CM | POA: Insufficient documentation

## 2019-11-01 DIAGNOSIS — Y9301 Activity, walking, marching and hiking: Secondary | ICD-10-CM | POA: Insufficient documentation

## 2019-11-01 DIAGNOSIS — S82852A Displaced trimalleolar fracture of left lower leg, initial encounter for closed fracture: Secondary | ICD-10-CM

## 2019-11-01 DIAGNOSIS — S82892A Other fracture of left lower leg, initial encounter for closed fracture: Secondary | ICD-10-CM | POA: Diagnosis present

## 2019-11-01 HISTORY — PX: ORIF ANKLE FRACTURE: SHX5408

## 2019-11-01 LAB — CBC WITH DIFFERENTIAL/PLATELET
Abs Immature Granulocytes: 0.01 10*3/uL (ref 0.00–0.07)
Basophils Absolute: 0 10*3/uL (ref 0.0–0.1)
Basophils Relative: 1 %
Eosinophils Absolute: 0 10*3/uL (ref 0.0–0.5)
Eosinophils Relative: 0 %
HCT: 39.5 % (ref 36.0–46.0)
Hemoglobin: 12.9 g/dL (ref 12.0–15.0)
Immature Granulocytes: 0 %
Lymphocytes Relative: 30 %
Lymphs Abs: 1.9 10*3/uL (ref 0.7–4.0)
MCH: 34 pg (ref 26.0–34.0)
MCHC: 32.7 g/dL (ref 30.0–36.0)
MCV: 104.2 fL — ABNORMAL HIGH (ref 80.0–100.0)
Monocytes Absolute: 0.3 10*3/uL (ref 0.1–1.0)
Monocytes Relative: 5 %
Neutro Abs: 4.1 10*3/uL (ref 1.7–7.7)
Neutrophils Relative %: 64 %
Platelets: 275 10*3/uL (ref 150–400)
RBC: 3.79 MIL/uL — ABNORMAL LOW (ref 3.87–5.11)
RDW: 11.9 % (ref 11.5–15.5)
WBC: 6.4 10*3/uL (ref 4.0–10.5)
nRBC: 0 % (ref 0.0–0.2)

## 2019-11-01 LAB — RAPID URINE DRUG SCREEN, HOSP PERFORMED
Amphetamines: NOT DETECTED
Barbiturates: NOT DETECTED
Benzodiazepines: NOT DETECTED
Cocaine: NOT DETECTED
Opiates: POSITIVE — AB
Tetrahydrocannabinol: POSITIVE — AB

## 2019-11-01 LAB — URINALYSIS, ROUTINE W REFLEX MICROSCOPIC
Bilirubin Urine: NEGATIVE
Glucose, UA: NEGATIVE mg/dL
Hgb urine dipstick: NEGATIVE
Ketones, ur: 20 mg/dL — AB
Leukocytes,Ua: NEGATIVE
Nitrite: NEGATIVE
Protein, ur: NEGATIVE mg/dL
Specific Gravity, Urine: 1.018 (ref 1.005–1.030)
pH: 5 (ref 5.0–8.0)

## 2019-11-01 LAB — COMPREHENSIVE METABOLIC PANEL
ALT: 12 U/L (ref 0–44)
AST: 22 U/L (ref 15–41)
Albumin: 4.4 g/dL (ref 3.5–5.0)
Alkaline Phosphatase: 52 U/L (ref 38–126)
Anion gap: 16 — ABNORMAL HIGH (ref 5–15)
BUN: 14 mg/dL (ref 6–20)
CO2: 20 mmol/L — ABNORMAL LOW (ref 22–32)
Calcium: 8.4 mg/dL — ABNORMAL LOW (ref 8.9–10.3)
Chloride: 106 mmol/L (ref 98–111)
Creatinine, Ser: 0.65 mg/dL (ref 0.44–1.00)
GFR, Estimated: >60 mL/min (ref >60)
Glucose, Bld: 76 mg/dL (ref 70–99)
Potassium: 3.7 mmol/L (ref 3.5–5.1)
Sodium: 142 mmol/L (ref 135–145)
Total Bilirubin: 0.3 mg/dL (ref 0.3–1.2)
Total Protein: 7.3 g/dL (ref 6.5–8.1)

## 2019-11-01 LAB — TYPE AND SCREEN
ABO/RH(D): O POS
Antibody Screen: NEGATIVE

## 2019-11-01 LAB — PROTIME-INR
INR: 1 (ref 0.8–1.2)
Prothrombin Time: 12.3 seconds (ref 11.4–15.2)

## 2019-11-01 LAB — POCT PREGNANCY, URINE
Preg Test, Ur: NEGATIVE
Preg Test, Ur: NEGATIVE

## 2019-11-01 LAB — ETHANOL: Alcohol, Ethyl (B): 266 mg/dL — ABNORMAL HIGH (ref <10)

## 2019-11-01 LAB — RESPIRATORY PANEL BY RT PCR (FLU A&B, COVID)
Influenza A by PCR: NEGATIVE
Influenza B by PCR: NEGATIVE
SARS Coronavirus 2 by RT PCR: NEGATIVE

## 2019-11-01 SURGERY — OPEN REDUCTION INTERNAL FIXATION (ORIF) ANKLE FRACTURE
Anesthesia: General | Site: Ankle | Laterality: Left

## 2019-11-01 MED ORDER — MORPHINE SULFATE (PF) 4 MG/ML IV SOLN
INTRAVENOUS | Status: AC
  Filled 2019-11-01: qty 2

## 2019-11-01 MED ORDER — 0.9 % SODIUM CHLORIDE (POUR BTL) OPTIME
TOPICAL | Status: DC | PRN
  Administered 2019-11-01: 1000 mL

## 2019-11-01 MED ORDER — HYDROMORPHONE HCL 1 MG/ML IJ SOLN
0.2500 mg | INTRAMUSCULAR | Status: DC | PRN
  Administered 2019-11-01 (×2): 0.5 mg via INTRAVENOUS

## 2019-11-01 MED ORDER — HYDROMORPHONE HCL 1 MG/ML IJ SOLN
1.0000 mg | Freq: Once | INTRAMUSCULAR | Status: AC
  Administered 2019-11-01: 1 mg via INTRAVENOUS
  Filled 2019-11-01: qty 1

## 2019-11-01 MED ORDER — MIDAZOLAM HCL 5 MG/5ML IJ SOLN
INTRAMUSCULAR | Status: DC | PRN
  Administered 2019-11-01: 2 mg via INTRAVENOUS

## 2019-11-01 MED ORDER — METHOCARBAMOL 500 MG PO TABS
500.0000 mg | ORAL_TABLET | Freq: Four times a day (QID) | ORAL | Status: DC | PRN
  Administered 2019-11-02 – 2019-11-03 (×2): 500 mg via ORAL
  Filled 2019-11-01 (×2): qty 1

## 2019-11-01 MED ORDER — SUCCINYLCHOLINE CHLORIDE 200 MG/10ML IV SOSY
PREFILLED_SYRINGE | INTRAVENOUS | Status: DC | PRN
  Administered 2019-11-01: 120 mg via INTRAVENOUS

## 2019-11-01 MED ORDER — DEXMEDETOMIDINE (PRECEDEX) IN NS 20 MCG/5ML (4 MCG/ML) IV SYRINGE
PREFILLED_SYRINGE | INTRAVENOUS | Status: AC
  Filled 2019-11-01: qty 5

## 2019-11-01 MED ORDER — CEFAZOLIN SODIUM-DEXTROSE 2-4 GM/100ML-% IV SOLN
2.0000 g | INTRAVENOUS | Status: AC
  Administered 2019-11-01: 2 g via INTRAVENOUS
  Filled 2019-11-01: qty 100

## 2019-11-01 MED ORDER — CHLORHEXIDINE GLUCONATE 0.12 % MT SOLN
15.0000 mL | Freq: Once | OROMUCOSAL | Status: AC
  Administered 2019-11-01: 15 mL via OROMUCOSAL

## 2019-11-01 MED ORDER — FENTANYL CITRATE (PF) 250 MCG/5ML IJ SOLN
INTRAMUSCULAR | Status: AC
  Filled 2019-11-01: qty 5

## 2019-11-01 MED ORDER — ACETAMINOPHEN 500 MG PO TABS
1000.0000 mg | ORAL_TABLET | Freq: Four times a day (QID) | ORAL | Status: AC
  Administered 2019-11-02 (×4): 1000 mg via ORAL
  Filled 2019-11-01 (×4): qty 2

## 2019-11-01 MED ORDER — DOCUSATE SODIUM 100 MG PO CAPS
100.0000 mg | ORAL_CAPSULE | Freq: Two times a day (BID) | ORAL | Status: DC
  Administered 2019-11-02 – 2019-11-03 (×3): 100 mg via ORAL
  Filled 2019-11-01 (×3): qty 1

## 2019-11-01 MED ORDER — VANCOMYCIN HCL 1000 MG IV SOLR
INTRAVENOUS | Status: AC
  Filled 2019-11-01: qty 1000

## 2019-11-01 MED ORDER — LACTATED RINGERS IV SOLN: INTRAVENOUS | Status: DC

## 2019-11-01 MED ORDER — ETOMIDATE 2 MG/ML IV SOLN: 12.0000 mg | Freq: Once | INTRAVENOUS | Status: DC

## 2019-11-01 MED ORDER — CLONIDINE HCL (ANALGESIA) 100 MCG/ML EP SOLN
EPIDURAL | Status: AC
  Filled 2019-11-01: qty 10

## 2019-11-01 MED ORDER — ACETAMINOPHEN 10 MG/ML IV SOLN
INTRAVENOUS | Status: AC
  Filled 2019-11-01: qty 100

## 2019-11-01 MED ORDER — LIDOCAINE 2% (20 MG/ML) 5 ML SYRINGE
INTRAMUSCULAR | Status: DC | PRN
  Administered 2019-11-01: 50 mg via INTRAVENOUS

## 2019-11-01 MED ORDER — DEXAMETHASONE SODIUM PHOSPHATE 10 MG/ML IJ SOLN
INTRAMUSCULAR | Status: DC | PRN
  Administered 2019-11-01: 10 mg via INTRAVENOUS

## 2019-11-01 MED ORDER — VANCOMYCIN HCL 1000 MG IV SOLR
INTRAVENOUS | Status: DC | PRN
  Administered 2019-11-01: 1000 mg via TOPICAL

## 2019-11-01 MED ORDER — CELECOXIB 200 MG PO CAPS
200.0000 mg | ORAL_CAPSULE | Freq: Two times a day (BID) | ORAL | Status: DC
  Administered 2019-11-01 – 2019-11-03 (×4): 200 mg via ORAL
  Filled 2019-11-01 (×4): qty 1

## 2019-11-01 MED ORDER — BUPIVACAINE HCL (PF) 0.25 % IJ SOLN
INTRAMUSCULAR | Status: DC | PRN
  Administered 2019-11-01: 30 mL

## 2019-11-01 MED ORDER — OXYCODONE HCL 5 MG PO TABS
5.0000 mg | ORAL_TABLET | ORAL | Status: DC | PRN
  Administered 2019-11-02 (×2): 5 mg via ORAL
  Administered 2019-11-02: 10 mg via ORAL
  Administered 2019-11-03 (×3): 5 mg via ORAL
  Filled 2019-11-01 (×3): qty 1
  Filled 2019-11-01: qty 2
  Filled 2019-11-01 (×2): qty 1
  Filled 2019-11-01: qty 2

## 2019-11-01 MED ORDER — PROPOFOL 10 MG/ML IV BOLUS
INTRAVENOUS | Status: DC | PRN
  Administered 2019-11-01: 170 mg via INTRAVENOUS

## 2019-11-01 MED ORDER — DEXMEDETOMIDINE (PRECEDEX) IN NS 20 MCG/5ML (4 MCG/ML) IV SYRINGE
PREFILLED_SYRINGE | INTRAVENOUS | Status: DC | PRN
  Administered 2019-11-01 (×3): 8 ug via INTRAVENOUS

## 2019-11-01 MED ORDER — METOCLOPRAMIDE HCL 5 MG/ML IJ SOLN: 5.0000 mg | Freq: Three times a day (TID) | INTRAMUSCULAR | Status: DC | PRN

## 2019-11-01 MED ORDER — ORAL CARE MOUTH RINSE: 15.0000 mL | Freq: Once | OROMUCOSAL | Status: AC

## 2019-11-01 MED ORDER — METHOCARBAMOL 1000 MG/10ML IJ SOLN
500.0000 mg | Freq: Four times a day (QID) | INTRAVENOUS | Status: DC | PRN
  Filled 2019-11-01: qty 5

## 2019-11-01 MED ORDER — STERILE WATER FOR IRRIGATION IR SOLN
Status: DC | PRN
  Administered 2019-11-01: 500 mL

## 2019-11-01 MED ORDER — METOCLOPRAMIDE HCL 5 MG PO TABS: 5.0000 mg | ORAL_TABLET | Freq: Three times a day (TID) | ORAL | Status: DC | PRN

## 2019-11-01 MED ORDER — CEFAZOLIN SODIUM-DEXTROSE 2-4 GM/100ML-% IV SOLN
2.0000 g | Freq: Three times a day (TID) | INTRAVENOUS | Status: AC
  Administered 2019-11-02 (×3): 2 g via INTRAVENOUS
  Filled 2019-11-01 (×3): qty 100

## 2019-11-01 MED ORDER — MORPHINE SULFATE (PF) 4 MG/ML IV SOLN
4.0000 mg | Freq: Once | INTRAVENOUS | Status: AC
  Administered 2019-11-01: 4 mg via INTRAVENOUS
  Filled 2019-11-01: qty 1

## 2019-11-01 MED ORDER — CHLORHEXIDINE GLUCONATE 4 % EX LIQD
60.0000 mL | Freq: Once | CUTANEOUS | Status: DC
  Filled 2019-11-01: qty 60

## 2019-11-01 MED ORDER — HYDROMORPHONE HCL 1 MG/ML IJ SOLN: 0.5000 mg | INTRAMUSCULAR | Status: DC | PRN

## 2019-11-01 MED ORDER — POVIDONE-IODINE 10 % EX SWAB: 2.0000 "application " | Freq: Once | CUTANEOUS | Status: DC

## 2019-11-01 MED ORDER — LACTATED RINGERS IV SOLN: INTRAVENOUS | Status: AC

## 2019-11-01 MED ORDER — CLONIDINE HCL (ANALGESIA) 100 MCG/ML EP SOLN
EPIDURAL | Status: DC | PRN
  Administered 2019-11-01: 100 ug

## 2019-11-01 MED ORDER — ONDANSETRON HCL 4 MG/2ML IJ SOLN
INTRAMUSCULAR | Status: DC | PRN
  Administered 2019-11-01: 4 mg via INTRAVENOUS

## 2019-11-01 MED ORDER — MORPHINE SULFATE (PF) 4 MG/ML IV SOLN
INTRAVENOUS | Status: DC | PRN
Start: 2019-11-01 — End: 2019-11-01
  Administered 2019-11-01 (×2): 4 mg via SUBCUTANEOUS

## 2019-11-01 MED ORDER — ONDANSETRON HCL 4 MG PO TABS: 4.0000 mg | ORAL_TABLET | Freq: Four times a day (QID) | ORAL | Status: DC | PRN

## 2019-11-01 MED ORDER — BUPIVACAINE HCL (PF) 0.25 % IJ SOLN
INTRAMUSCULAR | Status: AC
  Filled 2019-11-01: qty 30

## 2019-11-01 MED ORDER — CHLORHEXIDINE GLUCONATE 0.12 % MT SOLN
15.0000 mL | OROMUCOSAL | Status: AC
  Filled 2019-11-01 (×3): qty 15

## 2019-11-01 MED ORDER — ONDANSETRON HCL 4 MG/2ML IJ SOLN: 4.0000 mg | Freq: Four times a day (QID) | INTRAMUSCULAR | Status: DC | PRN

## 2019-11-01 MED ORDER — FENTANYL CITRATE (PF) 250 MCG/5ML IJ SOLN
INTRAMUSCULAR | Status: DC | PRN
  Administered 2019-11-01 (×2): 50 ug via INTRAVENOUS
  Administered 2019-11-01: 100 ug via INTRAVENOUS
  Administered 2019-11-01: 50 ug via INTRAVENOUS

## 2019-11-01 MED ORDER — HYDROMORPHONE HCL 1 MG/ML IJ SOLN
INTRAMUSCULAR | Status: AC
  Filled 2019-11-01: qty 1

## 2019-11-01 MED ORDER — MIDAZOLAM HCL 2 MG/2ML IJ SOLN
INTRAMUSCULAR | Status: AC
  Filled 2019-11-01: qty 2

## 2019-11-01 MED ORDER — ASPIRIN 81 MG PO CHEW
81.0000 mg | CHEWABLE_TABLET | Freq: Two times a day (BID) | ORAL | Status: DC
  Administered 2019-11-02 – 2019-11-03 (×4): 81 mg via ORAL
  Filled 2019-11-01 (×4): qty 1

## 2019-11-01 SURGICAL SUPPLY — 96 items
BIT DRILL 2.7 QC CANN 155 (BIT) ×1 IMPLANT
BIT DRILL 2.7 QC CANN 155MM (BIT) ×1
BIT DRILL OVR 3.5AO QC SHRT SM (DRILL) IMPLANT
BIT DRILL QC 2.0 SHORT EVOS SM (DRILL) IMPLANT
BIT DRILL QC 2.5MM SHRT EVO SM (DRILL) IMPLANT
BLADE SURG 10 STRL SS (BLADE) IMPLANT
BNDG CMPR 9X4 STRL LF SNTH (GAUZE/BANDAGES/DRESSINGS)
BNDG CMPR MED 10X6 ELC LF (GAUZE/BANDAGES/DRESSINGS) ×1
BNDG COHESIVE 6X5 TAN STRL LF (GAUZE/BANDAGES/DRESSINGS) IMPLANT
BNDG ELASTIC 4X5.8 VLCR STR LF (GAUZE/BANDAGES/DRESSINGS) ×3 IMPLANT
BNDG ELASTIC 6X10 VLCR STRL LF (GAUZE/BANDAGES/DRESSINGS) ×3 IMPLANT
BNDG ESMARK 4X9 LF (GAUZE/BANDAGES/DRESSINGS) ×1 IMPLANT
BNDG GAUZE ELAST 4 BULKY (GAUZE/BANDAGES/DRESSINGS) ×1 IMPLANT
COVER MAYO STAND STRL (DRAPES) ×2 IMPLANT
COVER SURGICAL LIGHT HANDLE (MISCELLANEOUS) ×3 IMPLANT
COVER WAND RF STERILE (DRAPES) ×1 IMPLANT
CUFF TOURN SGL QUICK 34 (TOURNIQUET CUFF)
CUFF TOURN SGL QUICK 42 (TOURNIQUET CUFF) IMPLANT
CUFF TRNQT CYL 34X4.125X (TOURNIQUET CUFF) IMPLANT
DRAPE C-ARM 42X72 X-RAY (DRAPES) ×3 IMPLANT
DRAPE INCISE IOBAN 66X45 STRL (DRAPES) ×2 IMPLANT
DRAPE SURG 17X23 STRL (DRAPES) ×1 IMPLANT
DRAPE U-SHAPE 47X51 STRL (DRAPES) ×3 IMPLANT
DRILL OVER 3.5 AO QC SHORT SM (DRILL) ×3
DRILL QC 2.0 SHORT EVOS SM (DRILL) ×3
DRILL QC 2.5MM SHORT EVOS SM (DRILL) ×3
DRSG AQUACEL AG ADV 3.5X 6 (GAUZE/BANDAGES/DRESSINGS) ×2 IMPLANT
DRSG PAD ABDOMINAL 8X10 ST (GAUZE/BANDAGES/DRESSINGS) ×3 IMPLANT
DURAPREP 26ML APPLICATOR (WOUND CARE) ×2 IMPLANT
ELECT CAUTERY BLADE 6.4 (BLADE) ×3 IMPLANT
ELECT REM PT RETURN 9FT ADLT (ELECTROSURGICAL) ×3
ELECTRODE REM PT RTRN 9FT ADLT (ELECTROSURGICAL) ×1 IMPLANT
GAUZE SPONGE 4X4 12PLY STRL (GAUZE/BANDAGES/DRESSINGS) ×1 IMPLANT
GAUZE XEROFORM 5X9 LF (GAUZE/BANDAGES/DRESSINGS) ×1 IMPLANT
GLOVE BIOGEL PI IND STRL 7.0 (GLOVE) ×1 IMPLANT
GLOVE BIOGEL PI IND STRL 8 (GLOVE) ×1 IMPLANT
GLOVE BIOGEL PI INDICATOR 7.0 (GLOVE) ×2
GLOVE BIOGEL PI INDICATOR 8 (GLOVE) ×4
GLOVE ECLIPSE 7.0 STRL STRAW (GLOVE) ×3 IMPLANT
GLOVE ECLIPSE 8.0 STRL XLNG CF (GLOVE) ×3 IMPLANT
GOWN STRL REUS W/ TWL LRG LVL3 (GOWN DISPOSABLE) ×3 IMPLANT
GOWN STRL REUS W/ TWL XL LVL3 (GOWN DISPOSABLE) IMPLANT
GOWN STRL REUS W/TWL LRG LVL3 (GOWN DISPOSABLE) ×9
GOWN STRL REUS W/TWL XL LVL3 (GOWN DISPOSABLE) ×3
HANDPIECE INTERPULSE COAX TIP (DISPOSABLE)
K-WIRE 1.3X40 (WIRE) ×6
KIT BASIN OR (CUSTOM PROCEDURE TRAY) ×3 IMPLANT
KIT TURNOVER KIT B (KITS) ×3 IMPLANT
KWIRE 1.3X40 (WIRE) IMPLANT
MANIFOLD NEPTUNE II (INSTRUMENTS) ×3 IMPLANT
NDL HYPO 25GX1X1/2 BEV (NEEDLE) ×1 IMPLANT
NEEDLE 22X1 1/2 (OR ONLY) (NEEDLE) ×2 IMPLANT
NEEDLE HYPO 25GX1X1/2 BEV (NEEDLE) ×3 IMPLANT
NS IRRIG 1000ML POUR BTL (IV SOLUTION) ×3 IMPLANT
PACK ORTHO EXTREMITY (CUSTOM PROCEDURE TRAY) ×3 IMPLANT
PAD ABD 8X10 STRL (GAUZE/BANDAGES/DRESSINGS) ×10 IMPLANT
PAD ARMBOARD 7.5X6 YLW CONV (MISCELLANEOUS) ×4 IMPLANT
PAD CAST 4YDX4 CTTN HI CHSV (CAST SUPPLIES) ×2 IMPLANT
PADDING CAST ABS 4INX4YD NS (CAST SUPPLIES) ×2
PADDING CAST ABS 6INX4YD NS (CAST SUPPLIES) ×2
PADDING CAST ABS COTTON 4X4 ST (CAST SUPPLIES) IMPLANT
PADDING CAST ABS COTTON 6X4 NS (CAST SUPPLIES) IMPLANT
PADDING CAST COTTON 4X4 STRL (CAST SUPPLIES)
PLATE 5H L 81MM FIBULA EVOS (Plate) ×2 IMPLANT
SCREW CANNULATED 4.1X40 (Screw) ×4 IMPLANT
SCREW CORT 2.7X10 STAR T8 EVOS (Screw) ×2 IMPLANT
SCREW CORT 2.7X12 EVOS (Screw) IMPLANT
SCREW CORT 2.7X15 T8 ST EVOS (Screw) ×4 IMPLANT
SCREW CORT 2.7X16 STAR T8 EVOS (Screw) ×2 IMPLANT
SCREW CORT 3.5X11 ST EVOS (Screw) ×2 IMPLANT
SCREW CORT EVOS ST 3.5X12 (Screw) ×2 IMPLANT
SCREW CORT EVOS ST T8 2.7X14MM (Screw) ×3 IMPLANT
SCREW CORTEX 2.7X15 (Screw) ×2 IMPLANT
SCREW CORTEX 3.5X18 EVOS (Screw) ×2 IMPLANT
SCREW EVOS 2.7X11 LOCK T8 (Screw) ×2 IMPLANT
SCREW LOCK ST EVOS 3.5X13 (Screw) ×2 IMPLANT
SET HNDPC FAN SPRY TIP SCT (DISPOSABLE) IMPLANT
STOCKINETTE IMPERVIOUS 9X36 MD (GAUZE/BANDAGES/DRESSINGS) IMPLANT
SUCTION FRAZIER HANDLE 10FR (MISCELLANEOUS) ×3
SUCTION TUBE FRAZIER 10FR DISP (MISCELLANEOUS) ×1 IMPLANT
SUT ETHILON 3 0 PS 1 (SUTURE) ×12 IMPLANT
SUT MNCRL AB 3-0 PS2 18 (SUTURE) ×2 IMPLANT
SUT VIC AB 2-0 CT1 27 (SUTURE) ×6
SUT VIC AB 2-0 CT1 36 (SUTURE) ×4 IMPLANT
SUT VIC AB 2-0 CT1 TAPERPNT 27 (SUTURE) IMPLANT
SUT VIC AB 2-0 CTB1 (SUTURE) ×3 IMPLANT
SUT VIC AB 3-0 SH 27 (SUTURE)
SUT VIC AB 3-0 SH 27X BRD (SUTURE) ×1 IMPLANT
SYR BULB IRRIG 60ML STRL (SYRINGE) ×2 IMPLANT
SYR CONTROL 10ML LL (SYRINGE) ×3 IMPLANT
TOWEL GREEN STERILE (TOWEL DISPOSABLE) ×3 IMPLANT
TOWEL GREEN STERILE FF (TOWEL DISPOSABLE) ×3 IMPLANT
TUBE CONNECTING 12'X1/4 (SUCTIONS) ×1
TUBE CONNECTING 12X1/4 (SUCTIONS) ×2 IMPLANT
WATER STERILE IRR 1000ML POUR (IV SOLUTION) ×3 IMPLANT
YANKAUER SUCT BULB TIP NO VENT (SUCTIONS) ×2 IMPLANT

## 2019-11-01 NOTE — Anesthesia Procedure Notes (Signed)
Procedure Name: Intubation Date/Time: 11/01/2019 7:07 PM Performed by: Griffin Dakin, CRNA Pre-anesthesia Checklist: Patient identified, Emergency Drugs available, Suction available and Patient being monitored Patient Re-evaluated:Patient Re-evaluated prior to induction Oxygen Delivery Method: Circle system utilized Preoxygenation: Pre-oxygenation with 100% oxygen Induction Type: IV induction, Rapid sequence and Cricoid Pressure applied Laryngoscope Size: Mac and 4 Grade View: Grade II Tube type: Oral Tube size: 7.5 mm Number of attempts: 1 Airway Equipment and Method: Stylet Placement Confirmation: ETT inserted through vocal cords under direct vision,  positive ETCO2 and breath sounds checked- equal and bilateral Secured at: 23 cm Tube secured with: Tape Dental Injury: Teeth and Oropharynx as per pre-operative assessment

## 2019-11-01 NOTE — Consult Note (Addendum)
Reason for Consult:Left ankle fx Referring Physician: Stanton Kidney  Veronica Le is an 44 y.o. female.  HPI: Veronica Le was jumping across a drainage ditch and slipped and fell. At first all she noticed was that her left foot was pointing the wrong direction but then she felt the pain. A neighbor called 911 and she was brought to the ED. X-rays showed a trimal fx and orthopedic surgery was consulted. She is currently unemployed.  Past Medical History:  Diagnosis Date  . Medical history non-contributory     Past Surgical History:  Procedure Laterality Date  . NO PAST SURGERIES      No family history on file.  Social History:  reports that she has never smoked. She has never used smokeless tobacco. She reports current alcohol use. She reports that she does not use drugs.  Allergies: No Known Allergies  Medications: I have reviewed the patient's current medications.  No results found for this or any previous visit (from the past 48 hour(s)).  DG Ankle Complete Left  Result Date: 11/01/2019 CLINICAL DATA:  Fall, left ankle deformity EXAM: LEFT ANKLE COMPLETE - 3+ VIEW; LEFT FOOT - COMPLETE 3+ VIEW COMPARISON:  None. FINDINGS: Acute transversely oriented fracture of the medial malleolus. Distal fracture fragment remains aligned with the medial talus. Oblique fracture through the distal fibular metaphysis. Lateral subluxation of the talus relative to the tibia without frank dislocation. No definite posterior malleolar fracture is identified. Subtalar alignment remains anatomic. No acute fractures identified within the foot. Diffuse soft tissue swelling about the ankle. IMPRESSION: 1. Unstable bimalleolar fracture of the left ankle with lateral subluxation of the talus relative to the tibial plafond. 2. No definite posterior malleolar fracture is seen. Electronically Signed   By: Duanne Guess D.O.   On: 11/01/2019 14:55   DG Foot Complete Left  Result Date: 11/01/2019 CLINICAL DATA:   Fall, left ankle deformity EXAM: LEFT ANKLE COMPLETE - 3+ VIEW; LEFT FOOT - COMPLETE 3+ VIEW COMPARISON:  None. FINDINGS: Acute transversely oriented fracture of the medial malleolus. Distal fracture fragment remains aligned with the medial talus. Oblique fracture through the distal fibular metaphysis. Lateral subluxation of the talus relative to the tibia without frank dislocation. No definite posterior malleolar fracture is identified. Subtalar alignment remains anatomic. No acute fractures identified within the foot. Diffuse soft tissue swelling about the ankle. IMPRESSION: 1. Unstable bimalleolar fracture of the left ankle with lateral subluxation of the talus relative to the tibial plafond. 2. No definite posterior malleolar fracture is seen. Electronically Signed   By: Duanne Guess D.O.   On: 11/01/2019 14:55    Review of Systems  HENT: Negative for ear discharge, ear pain, hearing loss and tinnitus.   Eyes: Negative for photophobia and pain.  Respiratory: Negative for cough and shortness of breath.   Cardiovascular: Negative for chest pain.  Gastrointestinal: Negative for abdominal pain, nausea and vomiting.  Genitourinary: Negative for dysuria, flank pain, frequency and urgency.  Musculoskeletal: Positive for arthralgias (Left ankle). Negative for back pain, myalgias and neck pain.  Neurological: Negative for dizziness and headaches.  Hematological: Does not bruise/bleed easily.  Psychiatric/Behavioral: The patient is not nervous/anxious.    Blood pressure 129/75, pulse (!) 112, temperature 98.4 F (36.9 C), temperature source Oral, resp. rate 18, SpO2 100 %, unknown if currently breastfeeding. Physical Exam Constitutional:      General: She is not in acute distress.    Appearance: She is well-developed. She is not diaphoretic.  HENT:  Head: Normocephalic and atraumatic.  Eyes:     General: No scleral icterus.       Right eye: No discharge.        Left eye: No discharge.      Conjunctiva/sclera: Conjunctivae normal.  Cardiovascular:     Rate and Rhythm: Normal rate and regular rhythm.  Pulmonary:     Effort: Pulmonary effort is normal. No respiratory distress.  Musculoskeletal:     Cervical back: Normal range of motion.     Comments: LLE No traumatic wounds, ecchymosis, or rash  Ankle mod TTP, edematous, ext rotated  No knee effusion  Knee stable to varus/ valgus and anterior/posterior stress  Sens DPN, SPN, TN intact  Motor EHL 5/5  DP 2+, PT 0, No significant edema  Skin:    General: Skin is warm and dry.  Neurological:     Mental Status: She is alert.  Psychiatric:        Behavior: Behavior normal.     Assessment/Plan: Left ankle fx -- Plan ORIF later this evening by Dr. August Saucer. Please keep NPO.    Freeman Caldron, PA-C Orthopedic Surgery [phone] 11/01/2019, 4:10 PM

## 2019-11-01 NOTE — Brief Op Note (Signed)
   11/01/2019  9:36 PM  PATIENT:  Suriah Ursin  43 y.o. female  PRE-OPERATIVE DIAGNOSIS:  Left ankle fx  POST-OPERATIVE DIAGNOSIS:  Left ankle fx  PROCEDURE:  Procedure(s): OPEN REDUCTION INTERNAL FIXATION (ORIF) ANKLE FRACTURE  SURGEON:  Surgeon(s): August Saucer, Corrie Mckusick, MD  ASSISTANT: magnant pa  ANESTHESIA:   general  EBL: 15 ml    Total I/O In: 1000 [I.V.:1000] Out: 25 [Blood:25]  BLOOD ADMINISTERED: none  DRAINS: none   LOCAL MEDICATIONS USED:  Marcaine mso4 clonidinee  SPECIMEN:  No Specimen  COUNTS:  YES  TOURNIQUET:  * No tourniquets in log *  DICTATION: .Other Dictation: Dictation Number [id number]  PLAN OF CARE: Admit for overnight observation  PATIENT DISPOSITION:  PACU - hemodynamically stable

## 2019-11-01 NOTE — Progress Notes (Signed)
Orthopedic Tech Progress Note Patient Details:  Veronica Le 02-Nov-1975 [id number] PA stated " patient is going to OR" Patient ID: Veronica Le, female   DOB: Aug 23, 1975, 44 y.o.   MRN: [id number]   Veronica Le 11/01/2019, 5:05 PM

## 2019-11-01 NOTE — H&P (Signed)
Veronica Le is an 44 y.o. female.   Chief Complaint: Left ankle pain HPI: Veronica Le is a 44 year old patient who was jumping over a ravine today and she injured her left ankle.  Denies any other orthopedic complaints.  Denies any loss of consciousness.  She is currently living with her godmother.  She is in the middle of a divorce.  No personal or family history of DVT or pulmonary embolism.  Denies any other orthopedic complaints.  Past Medical History:  Diagnosis Date  . Medical history non-contributory     Past Surgical History:  Procedure Laterality Date  . NO PAST SURGERIES      No family history on file. Social History:  reports that she has never smoked. She has never used smokeless tobacco. She reports current alcohol use. She reports that she does not use drugs.  Allergies: No Known Allergies  (Not in a hospital admission)   Results for orders placed or performed during the hospital encounter of 11/01/19 (from the past 48 hour(s))  Comprehensive metabolic panel     Status: Abnormal   Collection Time: 11/01/19  4:00 PM  Result Value Ref Range   Sodium 142 135 - 145 mmol/L   Potassium 3.7 3.5 - 5.1 mmol/L   Chloride 106 98 - 111 mmol/L   CO2 20 (L) 22 - 32 mmol/L   Glucose, Bld 76 70 - 99 mg/dL    Comment: Glucose reference range applies only to samples taken after fasting for at least 8 hours.   BUN 14 6 - 20 mg/dL   Creatinine, Ser 9.37 0.44 - 1.00 mg/dL   Calcium 8.4 (L) 8.9 - 10.3 mg/dL   Total Protein 7.3 6.5 - 8.1 g/dL   Albumin 4.4 3.5 - 5.0 g/dL   AST 22 15 - 41 U/L   ALT 12 0 - 44 U/L   Alkaline Phosphatase 52 38 - 126 U/L   Total Bilirubin 0.3 0.3 - 1.2 mg/dL   GFR, Estimated >90 >24 mL/min   Anion gap 16 (H) 5 - 15    Comment: Performed at [facility], [address] [place], [place] [address]  CBC with Differential     Status: Abnormal   Collection Time: 11/01/19  4:00 PM  Result Value Ref Range   WBC 6.4 4.0 - 10.5 K/uL   RBC 3.79 (L) 3.87 -  5.11 MIL/uL   Hemoglobin 12.9 12.0 - 15.0 g/dL   HCT 32.9 36 - 46 %   MCV 104.2 (H) 80.0 - 100.0 fL   MCH 34.0 26.0 - 34.0 pg   MCHC 32.7 30.0 - 36.0 g/dL   RDW 92.4 26.8 - 34.1 %   Platelets 275 150 - 400 K/uL   nRBC 0.0 0.0 - 0.2 %   Neutrophils Relative % 64 %   Neutro Abs 4.1 1.7 - 7.7 K/uL   Lymphocytes Relative 30 %   Lymphs Abs 1.9 0.7 - 4.0 K/uL   Monocytes Relative 5 %   Monocytes Absolute 0.3 0.1 - 1.0 K/uL   Eosinophils Relative 0 %   Eosinophils Absolute 0.0 0.0 - 0.5 K/uL   Basophils Relative 1 %   Basophils Absolute 0.0 0.0 - 0.1 K/uL   Immature Granulocytes 0 %   Abs Immature Granulocytes 0.01 0.00 - 0.07 K/uL    Comment: Performed at [facility], [address] [place], [place] [address]  Protime-INR     Status: None   Collection Time: 11/01/19  4:00 PM  Result Value Ref Range   Prothrombin Time 12.3 11.4 - 15.2 seconds   INR 1.0 0.8 - 1.2    Comment: (NOTE) INR goal varies based on device and disease states. Performed at [facility], [address] [place], [place] [address]   Ethanol     Status: Abnormal   Collection Time: 11/01/19  4:11 PM  Result Value Ref Range   Alcohol, Ethyl (B) 266 (H) <10 mg/dL    Comment: (NOTE) Lowest detectable limit for serum alcohol is 10 mg/dL.  For medical purposes only. Performed at [facility], [address], [place], [place] [address]   Type and screen [facility]     Status: None (Preliminary result)   Collection Time: 11/01/19  4:50 PM  Result Value Ref Range   ABO/RH(D) PENDING    Antibody Screen PENDING    Sample Expiration      11/04/2019,2359 Performed at [facility], [address], [place], [place] [address]    DG Ankle Complete Left  Result Date: 11/01/2019 CLINICAL DATA:  Fall, left ankle deformity EXAM: LEFT ANKLE COMPLETE - 3+ VIEW; LEFT FOOT - COMPLETE 3+ VIEW COMPARISON:  None. FINDINGS: Acute transversely oriented fracture of the medial malleolus.  Distal fracture fragment remains aligned with the medial talus. Oblique fracture through the distal fibular metaphysis. Lateral subluxation of the talus relative to the tibia without frank dislocation. No definite posterior malleolar fracture is identified. Subtalar alignment remains anatomic. No acute fractures identified within the foot. Diffuse soft tissue swelling about the ankle. IMPRESSION: 1. Unstable bimalleolar fracture of the left ankle with lateral subluxation of the talus relative to the tibial plafond. 2. No definite posterior malleolar fracture is seen. Electronically Signed   By: Duanne Guess D.O.   On: 11/01/2019 14:55   DG Foot Complete Left  Result Date: 11/01/2019 CLINICAL DATA:  Fall, left ankle deformity EXAM: LEFT ANKLE COMPLETE - 3+ VIEW; LEFT FOOT - COMPLETE 3+ VIEW COMPARISON:  None. FINDINGS: Acute transversely oriented fracture of the medial malleolus. Distal fracture fragment remains aligned with the medial talus. Oblique fracture through the distal fibular metaphysis. Lateral subluxation of the talus relative to the tibia without frank dislocation. No definite posterior malleolar fracture is identified. Subtalar alignment remains anatomic. No acute fractures identified within the foot. Diffuse soft tissue swelling about the ankle. IMPRESSION: 1. Unstable bimalleolar fracture of the left ankle with lateral subluxation of the talus relative to the tibial plafond. 2. No definite posterior malleolar fracture is seen. Electronically Signed   By: Duanne Guess D.O.   On: 11/01/2019 14:55    Review of Systems  Musculoskeletal: Positive for arthralgias.  All other systems reviewed and are negative.   Blood pressure 129/75, pulse (!) 112, temperature 98.4 F (36.9 C), temperature source Oral, resp. rate 18, SpO2 100 %, unknown if currently breastfeeding. Physical Exam HENT:     Head: Normocephalic.     Nose: Nose normal.     Mouth/Throat:     Mouth: Mucous membranes are  moist.  Eyes:     Pupils: Pupils are equal, round, and reactive to light.  Cardiovascular:     Rate and Rhythm: Normal rate.     Pulses: Normal pulses.  Pulmonary:     Effort: Pulmonary effort is normal.  Abdominal:     General: Abdomen is flat.  Musculoskeletal:     Cervical back: Normal range of motion.  Skin:    General: Skin is  warm.     Capillary Refill: Capillary refill takes less than 2 seconds.  Neurological:     General: No focal deficit present.     Mental Status: She is alert.  Psychiatric:        Mood and Affect: Mood normal.   Ortho exam demonstrates swelling in the left ankle.  Pedal pulses intact.  Sensation intact on the dorsal plantar aspect of the foot.  Compartments soft.  No issues with range of motion bilateral upper extremities or right lower extremity.  No effusion in either knees  Assessment/Plan Impression is bimalleolar ankle fracture left hand side.  Plan is open reduction internal fixation.  Risk benefits are discussed include not limited to infection nerve vessel damage nonunion malunion ankle stiffness ankle arthritis.  Patient understands risk benefits and wishes to proceed.  No personal or family history of DVT or pulmonary embolism.  Anticipate discharge tomorrow morning.  She is in a difficult family situation at this time but she does have a people she can stay with for least a couple of weeks.  The rehabilitative course is also discussed.  Burnard Bunting, MD 11/01/2019, 5:32 PM

## 2019-11-01 NOTE — ED Triage Notes (Signed)
Emergency Medicine Provider Triage Evaluation Note  Veronica Le , a 44 y.o. female  was evaluated in triage.  Pt complains of left ankle pain after stepping in hole outside her house. Admits to ETOH use. Patient states she did not hit her head.  Review of Systems  Positive: Left ankle pain  Negative: All other symptoms  Physical Exam  BP 129/75 (BP Location: Left Arm)   Pulse (!) 112   Temp 98.4 F (36.9 C) (Oral)   Resp 18   LMP  (LMP Unknown)   SpO2 100%  Gen:   Awake, no distress   HEENT:  Atraumatic  Resp:  Normal effort  Cardiac:  Normal rate  Abd:   Nondistended, nontender  MSK:  Left ankle swollen and tender to touch, slightly deformed Neuro:  Speech clear  Pulses: LLE pulse intact  Medical Decision Making  Medically screening exam initiated at 3:19 PM.  Appropriate orders placed.  Veronica Le was informed that the remainder of the evaluation will be completed by another provider, this initial triage assessment does not replace that evaluation, and the importance of remaining in the ED until their evaluation is complete.  Clinical Impression  Suspect ankle fx, ankle sprain, x rays have been ordered and pain medicine ordered. Oncoming ED physician Dr. Donnald Garre to take over care of patient at this time. Patient stable.    Veronica Norfolk, DO 11/01/19 1522

## 2019-11-01 NOTE — Anesthesia Postprocedure Evaluation (Signed)
Anesthesia Post Note  Patient: Veronica Le  Procedure(s) Performed: OPEN REDUCTION INTERNAL FIXATION (ORIF) ANKLE FRACTURE (Left Ankle)     Patient location during evaluation: PACU Anesthesia Type: General Level of consciousness: awake Pain management: pain level controlled Vital Signs Assessment: post-procedure vital signs reviewed and stable Respiratory status: spontaneous breathing Cardiovascular status: stable Postop Assessment: no apparent nausea or vomiting Anesthetic complications: no   No complications documented.  Last Vitals:  Vitals:   11/01/19 1331 11/01/19 2145  BP: 129/75 140/70  Pulse: (!) 112 (!) 115  Resp: 18 (!) 21  Temp: 36.9 C 37.3 C  SpO2: 100% 98%    Last Pain:  Vitals:   11/01/19 1331  TempSrc: Oral    LLE Motor Response: Purposeful movement;Responds to commands (11/01/19 2145) LLE Sensation: Full sensation (11/01/19 2145)          Sabrina Arriaga

## 2019-11-01 NOTE — Transfer of Care (Signed)
Immediate Anesthesia Transfer of Care Note  Patient: Veronica Le  Procedure(s) Performed: OPEN REDUCTION INTERNAL FIXATION (ORIF) ANKLE FRACTURE (Left Ankle)  Patient Location: PACU  Anesthesia Type:General  Level of Consciousness: awake, alert  and oriented  Airway & Oxygen Therapy: Patient Spontanous Breathing and Patient connected to nasal cannula oxygen  Post-op Assessment: Report given to RN and Post -op Vital signs reviewed and stable  Post vital signs: Reviewed and stable  Last Vitals:  Vitals Value Taken Time  BP 140/70 11/01/19 2146  Temp    Pulse 112 11/01/19 2146  Resp 18 11/01/19 2146  SpO2 99 % 11/01/19 2146  Vitals shown include unvalidated device data.  Last Pain:  Vitals:   11/01/19 1331  TempSrc: Oral     Report to Chip RN in PACU, patient pleasant and conversing with PACU staff.    Complications: No complications documented.

## 2019-11-01 NOTE — Anesthesia Preprocedure Evaluation (Signed)
Anesthesia Evaluation  Patient identified by MRN, date of birth, ID band Patient awake  General Assessment Comment:History noted. CG  Airway Mallampati: II  TM Distance: >3 FB     Dental   Pulmonary neg pulmonary ROS,    breath sounds clear to auscultation       Cardiovascular negative cardio ROS   Rhythm:Regular Rate:Normal     Neuro/Psych    GI/Hepatic negative GI ROS, Neg liver ROS,   Endo/Other  negative endocrine ROS  Renal/GU      Musculoskeletal   Abdominal   Peds  Hematology   Anesthesia Other Findings   Reproductive/Obstetrics                             Anesthesia Physical Anesthesia Plan  ASA: II  Anesthesia Plan: General   Post-op Pain Management:    Induction: Intravenous, Rapid sequence and Cricoid pressure planned  PONV Risk Score and Plan: 3 and Ondansetron, Dexamethasone and Midazolam  Airway Management Planned: Oral ETT  Additional Equipment:   Intra-op Plan:   Post-operative Plan:   Informed Consent: I have reviewed the patients History and Physical, chart, labs and discussed the procedure including the risks, benefits and alternatives for the proposed anesthesia with the patient or authorized representative who has indicated his/her understanding and acceptance.     Dental advisory given  Plan Discussed with: CRNA and Anesthesiologist  Anesthesia Plan Comments:         Anesthesia Quick Evaluation

## 2019-11-01 NOTE — ED Provider Notes (Signed)
[facility] EMERGENCY DEPARTMENT Provider Note   CSN: [id number] Arrival date & time: 11/01/19  1321     History Chief Complaint  Patient presents with  . Leg Pain    Veronica Le is a 44 y.o. female.  HPI Patient is otherwise healthy.  She has no medical history.  Patient was hurrying to cross a small grassy ditch.  She reports she jumps over this frequently and never has any problems.  She is not sure what happened she slipped and ended up really injuring her left ankle.  She denied any other injury.  She did not strike her head.  She has no neck pain, chest pain or abdominal pain.  The ankle is severely painful.  EMS transported the patient in a temporary splint.  Patient reports she does drink alcohol.  She had about 2 shots today.  She reports she also smokes marijuana sometimes.  None today.  Patient does not drink on a daily basis.  She has never had issues with alcohol withdrawal.  No known drug allergies.  Not vaccinated for Covid.  No known drug allergies to anesthesia.  Patient however is only had an epidural for pregnancy.    Past Medical History:  Diagnosis Date  . Medical history non-contributory     Patient Active Problem List   Diagnosis Date Noted  . AMA (advanced maternal age) multigravida 36+, third trimester 12/02/2016    Past Surgical History:  Procedure Laterality Date  . NO PAST SURGERIES       OB History    Gravida  9   Para  4   Term  4   Preterm      AB  5   Living  4     SAB      TAB  5   Ectopic      Multiple  0   Live Births  1           No family history on file.  Social History   Tobacco Use  . Smoking status: Never Smoker  . Smokeless tobacco: Never Used  Substance Use Topics  . Alcohol use: Yes  . Drug use: No    Home Medications Prior to Admission medications   Medication Sig Start Date End Date Taking? Authorizing Provider  benzocaine (ORAJEL) 10 % mucosal gel Use as directed 1  application in the mouth or throat as needed for mouth pain. 08/02/17   Dartha Lodge, PA-C  Prenatal Vit w/Fe-Methylfol-FA (PNV PO) Take 1 tablet by mouth daily.     [provider]    Allergies    Patient has no known allergies.  Review of Systems   Review of Systems 10 systems reviewed and negative except as per HPI Physical Exam Updated Vital Signs BP 129/75 (BP Location: Left Arm)   Pulse (!) 112   Temp 98.4 F (36.9 C) (Oral)   Resp 18   LMP  (LMP Unknown)   SpO2 100%   Physical Exam Constitutional:      Comments: Alert and nontoxic.  Mental status is clear.  Slightly tearful but appropriate.  HENT:     Head: Normocephalic and atraumatic.     Mouth/Throat:     Mouth: Mucous membranes are moist.     Pharynx: Oropharynx is clear.     Comments: Dentition intact. Eyes:     Extraocular Movements: Extraocular movements intact.  Cardiovascular:     Rate and Rhythm: Normal rate and regular rhythm.  Pulmonary:     Effort: Pulmonary effort is normal.     Breath sounds: Normal breath sounds.  Chest:     Chest wall: No tenderness.  Abdominal:     General: There is no distension.     Palpations: Abdomen is soft.     Tenderness: There is no abdominal tenderness. There is no guarding.  Musculoskeletal:     Comments: Deformity at left ankle with significant external rotation of the foot.  Large swelling of the ankle.  Dorsalis pedis pulses 2+ and strong.  Toes are warm and dry.  Skin:    General: Skin is warm and dry.  Neurological:     General: No focal deficit present.     Mental Status: She is oriented to person, place, and time.     Cranial Nerves: No cranial nerve deficit.     Coordination: Coordination normal.     ED Results / Procedures / Treatments   Labs (all labs ordered are listed, but only abnormal results are displayed) Labs Reviewed  RESPIRATORY PANEL BY RT PCR (FLU A&B, COVID)  COMPREHENSIVE METABOLIC PANEL  ETHANOL  CBC WITH  DIFFERENTIAL/PLATELET  PROTIME-INR  URINALYSIS, ROUTINE W REFLEX MICROSCOPIC  RAPID URINE DRUG SCREEN, HOSP PERFORMED  TYPE AND SCREEN    EKG None  Radiology DG Ankle Complete Left  Result Date: 11/01/2019 CLINICAL DATA:  Fall, left ankle deformity EXAM: LEFT ANKLE COMPLETE - 3+ VIEW; LEFT FOOT - COMPLETE 3+ VIEW COMPARISON:  None. FINDINGS: Acute transversely oriented fracture of the medial malleolus. Distal fracture fragment remains aligned with the medial talus. Oblique fracture through the distal fibular metaphysis. Lateral subluxation of the talus relative to the tibia without frank dislocation. No definite posterior malleolar fracture is identified. Subtalar alignment remains anatomic. No acute fractures identified within the foot. Diffuse soft tissue swelling about the ankle. IMPRESSION: 1. Unstable bimalleolar fracture of the left ankle with lateral subluxation of the talus relative to the tibial plafond. 2. No definite posterior malleolar fracture is seen. Electronically Signed   By: Duanne Guess D.O.   On: 11/01/2019 14:55   DG Foot Complete Left  Result Date: 11/01/2019 CLINICAL DATA:  Fall, left ankle deformity EXAM: LEFT ANKLE COMPLETE - 3+ VIEW; LEFT FOOT - COMPLETE 3+ VIEW COMPARISON:  None. FINDINGS: Acute transversely oriented fracture of the medial malleolus. Distal fracture fragment remains aligned with the medial talus. Oblique fracture through the distal fibular metaphysis. Lateral subluxation of the talus relative to the tibia without frank dislocation. No definite posterior malleolar fracture is identified. Subtalar alignment remains anatomic. No acute fractures identified within the foot. Diffuse soft tissue swelling about the ankle. IMPRESSION: 1. Unstable bimalleolar fracture of the left ankle with lateral subluxation of the talus relative to the tibial plafond. 2. No definite posterior malleolar fracture is seen. Electronically Signed   By: Duanne Guess D.O.    On: 11/01/2019 14:55    Procedures Procedures (including critical care time)  Medications Ordered in ED Medications  HYDROmorphone (DILAUDID) injection 1 mg (has no administration in time range)  lactated ringers infusion (has no administration in time range)  etomidate (AMIDATE) injection 12 mg (has no administration in time range)  morphine 4 MG/ML injection 4 mg (4 mg Intravenous Given 11/01/19 1447)    ED Course  I have reviewed the triage vital signs and the nursing notes.  Pertinent labs & imaging results that were available during my care of the patient were reviewed by me and considered in my medical  decision making (see chart for details).    MDM Rules/Calculators/A&P                         Consult: [facility] will see the patient in the emergency department.  Plan to perform sedation and reduction with splinting.  Patient had a mechanical fall.  She sustained a displaced ankle fracture.  Patient has been treated for pain.  She otherwise does not have other associated injuries.  Orthopedics consulted.  At this time, plan will be for patient going directly to the OR.  We will not proceed with sedation and splinting in the ED. Final Clinical Impression(s) / ED Diagnoses Final diagnoses:  Closed trimalleolar fracture of left ankle, initial encounter    Rx / DC Orders ED Discharge Orders    None       Arby Barrette, MD 11/01/19 2201

## 2019-11-01 NOTE — ED Triage Notes (Signed)
Pt arrives via [facility] with c/o L ankle pain and deformity after falling today while walking outside. ETOH on board. Pt uncooperative and verbally aggressive upon arrival to triage. Pt received fentanyl pta. Denies LOC.

## 2019-11-02 ENCOUNTER — Other Ambulatory Visit: Payer: Self-pay

## 2019-11-02 NOTE — Progress Notes (Signed)
  Subjective: Patient is a 44 year old female presents POD1 s/p left ankle ORIF.  She states that pain is controlled overall.  She is maintaining her nonweightbearing status.  Objective: Vital signs in last 24 hours: Temp:  [98.3 F (36.8 C)-99.1 F (37.3 C)] 98.3 F (36.8 C) (10/21 0725) Pulse Rate:  [79-115] 82 (10/21 0725) Resp:  [13-21] 17 (10/21 0725) BP: (128-146)/(67-84) 128/78 (10/21 0725) SpO2:  [96 %-100 %] 100 % (10/21 0725) Weight:  [77.1 kg] 77.1 kg (10/20 2306)  Intake/Output from previous day: 10/20 0701 - 10/21 0700 In: 1702.1 [I.V.:1702.1] Out: 725 [Urine:700; Blood:25] Intake/Output this shift: Total I/O In: 360 [P.O.:360] Out: -   Exam:  Postop splint in good position Able to flex and extend left toes Left toes are warm and well-perfused   Labs: Recent Labs    11/01/19 1600  HGB 12.9   Recent Labs    11/01/19 1600  WBC 6.4  RBC 3.79*  HCT 39.5  PLT 275   Recent Labs    11/01/19 1600  NA 142  K 3.7  CL 106  CO2 20*  BUN 14  CREATININE 0.65  GLUCOSE 76  CALCIUM 8.4*   Recent Labs    11/01/19 1600  INR 1.0    Assessment/Plan: Patient is a 44 year old female presents POD1 s/p left ankle ORIF.  She is doing well today and she is appropriate for discharge home.  However, she has no permanent residence at this time and she is reaching out to friends and family to see if she can stay with any of them.  Plan for discharge when patient has a stable discharge plan.  Will call patient's room later or discussed with her in person later today.  Maintain nonweightbearing status.  Follow-up with Dr. August Saucer in 1 week.   Yacqub Baston L Haeli Gerlich 11/02/2019, 12:22 PM

## 2019-11-02 NOTE — Op Note (Signed)
[name], PROFFIT MEDICAL RECORD [id number] ACCOUNT [id number] DATE OF BIRTH:11-09-1975 FACILITY: [facility] LOCATION: [facility] PHYSICIAN:Cambrea Kirt Diamantina Providence, MD  OPERATIVE REPORT  DATE OF PROCEDURE:  11/01/2019  PREOPERATIVE DIAGNOSIS:  Left bimalleolar ankle fracture.  POSTOPERATIVE DIAGNOSIS:  Left bimalleolar ankle fracture.  PROCEDURE:  Open reduction internal fixation of left bimalleolar ankle fracture.  SURGEON:  Cammy Copa, MD  ASSISTANT:  Karenann Cai PA.  INDICATIONS:  The patient is a 44 year old female with left ankle pain following a fall.  Presents now for operative management after explanation of risks and benefits of bimalleolar ankle fracture unstable.  PROCEDURE IN DETAIL:  The patient was brought to the operating room where general anesthetic was induced.  Preoperative antibiotics administered.  Timeout was called.  Left ankle prescrubbed with alcohol and Betadine, allowed to air dry, prepped with  DuraPrep solution and draped in sterile manner.  Ioban used to cover the operative field.  Timeout was called.  Ankle Esmarch utilized for approximately 60 minutes.  Lateral incision was made.  Skin and subcutaneous tissue were sharply divided.  Sensory  branch peroneal nerve was protected.  Fracture was identified and irrigated.  Fracture was reduced.  Lag screw was applied proximal anterior to distal posterior.  The plate applied and fixed with nonlocking screws proximally, locking screws distally.   Good reduction was achieved.  Mortise was stable.  This incision was irrigated.  Attention was then directed towards the medial side.  Incision made over the medial malleolar fracture.  Saphenous vein and nerve protected.  Fracture site was identified  and irrigated.  Fracture was reduced.  Two pins placed across the fracture site.  Two 40 mm cannulated screws partially threaded from [employer] were placed.  The plate also from [place] [employer] [place].  This gave very stable  fixation.  Syndesmosis  stable.  Mortise is symmetric.  Good screw placement confirmed in the AP and lateral planes under fluoroscopy.  Ankle Esmarch released.  Incision was irrigated.  Vancomycin powder placed.  Incision was closed using 2-0 Vicryl and 3-0 nylon.  Aquacel  dressings and bulky posterior splint applied.  The patient tolerated the procedure well without immediate complications, transferred to the recovery room in stable condition.  Luke's assistance was required at all times for opening, closing, mobilization  of tissue.  His assistance was medical necessity.  HN/NUANCE  D:11/01/2019 T:11/02/2019 JOB:[id number]

## 2019-11-02 NOTE — Plan of Care (Signed)

## 2019-11-02 NOTE — Plan of Care (Signed)

## 2019-11-02 NOTE — Evaluation (Signed)
Physical Therapy Evaluation Patient Details Name: Veronica Le MRN: [id number] DOB: [birth year] Today's Date: 11/02/2019   History of Present Illness  Patient is a 44 year old patient who was jumping over a ravine today and she injured her left ankle.  Denies any other orthopedic complaints.  Denies any loss of consciousness. Patient underwent ORIF of L ankle on 10/20. No significant PMH.  Clinical Impression  PTA, patient was living with godmother for a short time and requiring assistance for transportation, however patient states she may be able to go home with her godmother but wants to be able to get her own place and is not sure where she will go at discharge. Patient is overwhelmed emotionally about current situation, provided comfort to patient during conversation. Overall, patient required supervision for bed mobility and min guard for transfers and ambulation. Patient is limited by pain, decreased activity tolerance, WB restrictions, decreased strength, and impaired functional mobility. Barriers to discharge are no definitive discharge plan due to current situation. Patient will benefit from skilled PT services in the acute setting to address listed deficits and maximize functional mobility prior to discharge. Anticipate no PT follow up initially, however recommend OPPT following WB restriction change.    Follow Up Recommendations No PT follow up (recommend OPPT following WB change)    Equipment Recommendations  Rolling Alphus Zeck with 5" wheels;3in1 (PT);Wheelchair (measurements PT)    Recommendations for Other Services       Precautions / Restrictions Precautions Precautions: Fall Required Braces or Orthoses: Splint/Cast Splint/Cast: short leg splint Restrictions Weight Bearing Restrictions: Yes LLE Weight Bearing: Non weight bearing      Mobility  Bed Mobility Overal bed mobility: Needs Assistance Bed Mobility: Supine to Sit;Sit to Supine     Supine to sit:  Supervision;HOB elevated Sit to supine: Supervision;HOB elevated        Transfers Overall transfer level: Needs assistance Equipment used: Rolling Dorotea Hand (2 wheeled) Transfers: Sit to/from Stand Sit to Stand: Min guard         General transfer comment: cues required for hand placement and education on use of RW and maintaining WB precautions  Ambulation/Gait Ambulation/Gait assistance: Min guard Gait Distance (Feet): 20 Feet Assistive device: Rolling Danylah Holden (2 wheeled) Gait Pattern/deviations: Step-to pattern     General Gait Details: patient ambulated fwd/bwd with RW, cues required for technique   Stairs            Wheelchair Mobility    Modified Rankin (Stroke Patients Only)       Balance Overall balance assessment: Needs assistance Sitting-balance support: No upper extremity supported;Feet supported Sitting balance-Leahy Scale: Good     Standing balance support: Bilateral upper extremity supported;During functional activity Standing balance-Leahy Scale: Fair                               Pertinent Vitals/Pain Pain Assessment: Faces Faces Pain Scale: Hurts little more Pain Location: L ankle Pain Descriptors / Indicators: Grimacing;Guarding;Operative site guarding Pain Intervention(s): Monitored during session;Repositioned    Home Living Family/patient expects to be discharged to:: Private residence (unsure where she will be discharging to due to current situa) Living Arrangements: Other relatives   Type of Home: House Home Access: Stairs to enter Entrance Stairs-Rails: None Entrance Stairs-Number of Steps: 3 Home Layout: Two level Home Equipment: None Additional Comments: Unsure where she will be going at discharge due to current situation (going through a divorce); was previously living with her godmother; will  check back on discahrge disposition    Prior Function Level of Independence: Independent;Needs assistance          Comments: Currently does not have a car; relies on others to drive her for errands and other activities.     Hand Dominance        Extremity/Trunk Assessment   Upper Extremity Assessment Upper Extremity Assessment: Overall WFL for tasks assessed    Lower Extremity Assessment Lower Extremity Assessment: LLE deficits/detail LLE: Unable to fully assess due to immobilization       Communication   Communication: No difficulties  Cognition Arousal/Alertness: Awake/alert Behavior During Therapy: WFL for tasks assessed/performed Overall Cognitive Status: Within Functional Limits for tasks assessed                                        General Comments General comments (skin integrity, edema, etc.): Patient is currently unsure of living situation due to recently going through divorce. Patient was emotional during session when discussing current situation, provided comfort during conversation. Patient is motivated to get back to her independence    Exercises     Assessment/Plan    PT Assessment Patient needs continued PT services  PT Problem List Decreased strength;Decreased activity tolerance;Decreased balance;Decreased mobility;Pain       PT Treatment Interventions DME instruction;Gait training;Stair training;Functional mobility training;Therapeutic activities;Therapeutic exercise;Balance training;Patient/family education;Wheelchair mobility training    PT Goals (Current goals can be found in the Care Plan section)  Acute Rehab PT Goals Patient Stated Goal: to get back to being independent PT Goal Formulation: With patient Time For Goal Achievement: 11/16/19 Potential to Achieve Goals: Good    Frequency Min 5X/week   Barriers to discharge Other (comment) (no definite discharge plan due to current living situation)      Co-evaluation               AM-PAC PT "6 Clicks" Mobility  Outcome Measure Help needed turning from your back to your side  while in a flat bed without using bedrails?: A Little Help needed moving from lying on your back to sitting on the side of a flat bed without using bedrails?: A Little Help needed moving to and from a bed to a chair (including a wheelchair)?: A Little Help needed standing up from a chair using your arms (e.g., wheelchair or bedside chair)?: A Little Help needed to walk in hospital room?: A Little Help needed climbing 3-5 steps with a railing? : A Little 6 Click Score: 18    End of Session Equipment Utilized During Treatment: Gait belt Activity Tolerance: Patient tolerated treatment well Patient left: in bed;with call bell/phone within reach;with bed alarm set Nurse Communication: Mobility status PT Visit Diagnosis: Unsteadiness on feet (R26.81);Other abnormalities of gait and mobility (R26.89);Difficulty in walking, not elsewhere classified (R26.2);Pain Pain - Right/Left: Left Pain - part of body: Ankle and joints of foot    Time: 0811-0909 PT Time Calculation (min) (ACUTE ONLY): 58 min   Charges:   PT Evaluation $PT Eval Moderate Complexity: 1 Mod PT Treatments $Therapeutic Activity: 23-37 mins        Gregor Hams, PT, DPT Acute Rehabilitation Services Pager [phone] Office [phone]   Zannie Kehr Allred 11/02/2019, 9:28 AM

## 2019-11-03 MED ORDER — DOCUSATE SODIUM 100 MG PO CAPS
100.0000 mg | ORAL_CAPSULE | Freq: Two times a day (BID) | ORAL | 0 refills | Status: DC
Start: 2019-11-03 — End: 2020-06-16

## 2019-11-03 MED ORDER — ASPIRIN 81 MG PO CHEW
81.0000 mg | CHEWABLE_TABLET | Freq: Two times a day (BID) | ORAL | 0 refills | Status: DC
Start: 2019-11-03 — End: 2019-11-28

## 2019-11-03 MED ORDER — OXYCODONE HCL 5 MG PO TABS
5.0000 mg | ORAL_TABLET | ORAL | 0 refills | Status: DC | PRN
Start: 2019-11-03 — End: 2019-11-22

## 2019-11-03 MED ORDER — METHOCARBAMOL 500 MG PO TABS: 500.0000 mg | ORAL_TABLET | Freq: Three times a day (TID) | ORAL | 0 refills | Status: DC | PRN

## 2019-11-03 MED ORDER — CELECOXIB 200 MG PO CAPS
200.0000 mg | ORAL_CAPSULE | Freq: Two times a day (BID) | ORAL | 0 refills | Status: DC
Start: 2019-11-03 — End: 2021-10-29

## 2019-11-03 NOTE — Progress Notes (Signed)
Patient stable Left toes perfused and sensate Plan on discharge today.  Nonweightbearing left lower extremity.  Follow-up in 11 days for suture removal

## 2019-11-03 NOTE — Progress Notes (Signed)
Physical Therapy Treatment Patient Details Name: Veronica Le MRN: [id number] DOB: 06-Jun-1975 Today's Date: 11/03/2019    History of Present Illness Patient is a 44 year old patient who was jumping over a ravine today and she injured her left ankle.  Denies any other orthopedic complaints.  Denies any loss of consciousness. Patient underwent ORIF of L ankle on 10/20. No significant PMH.    PT Comments    Patient progressing towards physical therapy goals. Session focused on ambulation and stair negotiation. Patient negotiated 2 x 2 stairs with RW backwards and min guard, assist to stabilize RW. Patient ambulated 22' with RW and min guard. Per patient request, discussed the comparison of stability of crutches and RW, demonstrate use of crutches with patient apprehensive to attempt ambulation. No follow PT recommended at this time, however recommend OPPT following WB change to maximize functional independence.     Follow Up Recommendations  No PT follow up (recommend OPPT following WB change)     Equipment Recommendations  Rolling Areeba Sulser with 5" wheels;3in1 (PT);Wheelchair (measurements PT)    Recommendations for Other Services       Precautions / Restrictions Precautions Precautions: Fall Required Braces or Orthoses: Splint/Cast Splint/Cast: short leg splint Restrictions Weight Bearing Restrictions: Yes LLE Weight Bearing: Non weight bearing    Mobility  Bed Mobility Overal bed mobility: Modified Independent                Transfers Overall transfer level: Needs assistance Equipment used: Rolling Mitra Duling (2 wheeled) Transfers: Sit to/from Stand Sit to Stand: Supervision            Ambulation/Gait Ambulation/Gait assistance: Min guard Gait Distance (Feet): 35 Feet Assistive device: Rolling Caroleen Stoermer (2 wheeled) Gait Pattern/deviations: Step-to pattern     General Gait Details: Per patient request, discussed the comparison between crutches and RW. Demonstrated  use of crutches with patient apprehensive to attempt   Stairs Stairs: Yes Stairs assistance: Min guard Stair Management: Backwards;With Kayla Weekes Number of Stairs: 4 (2 x 2 stairs) General stair comments: min guard and stabilization of RW   Wheelchair Mobility    Modified Rankin (Stroke Patients Only)       Balance Overall balance assessment: Needs assistance Sitting-balance support: No upper extremity supported;Feet supported Sitting balance-Leahy Scale: Good     Standing balance support: Bilateral upper extremity supported;During functional activity Standing balance-Leahy Scale: Fair                              Cognition Arousal/Alertness: Awake/alert Behavior During Therapy: WFL for tasks assessed/performed Overall Cognitive Status: Within Functional Limits for tasks assessed                                        Exercises      General Comments        Pertinent Vitals/Pain Pain Assessment: Faces Faces Pain Scale: Hurts a little bit Pain Location: L ankle Pain Descriptors / Indicators: Grimacing;Guarding;Operative site guarding Pain Intervention(s): Monitored during session;Repositioned    Home Living                      Prior Function            PT Goals (current goals can now be found in the care plan section) Acute Rehab PT Goals Patient Stated Goal: to be independent  PT Goal  Formulation: With patient Time For Goal Achievement: 11/16/19 Potential to Achieve Goals: Good Progress towards PT goals: Progressing toward goals    Frequency    Min 5X/week      PT Plan Current plan remains appropriate    Co-evaluation              AM-PAC PT "6 Clicks" Mobility   Outcome Measure  Help needed turning from your back to your side while in a flat bed without using bedrails?: None Help needed moving from lying on your back to sitting on the side of a flat bed without using bedrails?: None Help needed  moving to and from a bed to a chair (including a wheelchair)?: A Little Help needed standing up from a chair using your arms (e.g., wheelchair or bedside chair)?: None Help needed to walk in hospital room?: A Little Help needed climbing 3-5 steps with a railing? : A Little 6 Click Score: 21    End of Session Equipment Utilized During Treatment: Gait belt Activity Tolerance: Patient tolerated treatment well Patient left: in chair;with call bell/phone within reach Nurse Communication: Mobility status PT Visit Diagnosis: Unsteadiness on feet (R26.81);Other abnormalities of gait and mobility (R26.89);Difficulty in walking, not elsewhere classified (R26.2);Pain Pain - Right/Left: Left Pain - part of body: Ankle and joints of foot     Time: 1130-1212 PT Time Calculation (min) (ACUTE ONLY): 42 min  Charges:  $Therapeutic Activity: 38-52 mins                     Gregor Hams, PT, DPT Acute Rehabilitation Services Pager [phone] Office [phone]    Veronica Le 11/03/2019, 12:25 PM

## 2019-11-03 NOTE — TOC Transition Note (Addendum)
Transition of Care [facility]) - CM/SW Discharge Note   Patient Details  Name: Veronica Le MRN: [id number] Date of Birth: 17-Jan-1975  Transition of Care [facility]) CM/SW Contact:  Epifanio Lesches, RN Phone Number: [phone] 11/03/2019, 3:17 PM   Clinical Narrative:    Patient will DC to: home Anticipated DC date:11/03/2019 Family notified: yes Transport by: car      - s/p left ankle ORIF,10/21  Per MD patient ready for DC today. RN, patient, and patient's family ( god mother) notified of DC.  Pt's states she will reside with god mother once d/c. Pt with DME needs noted. Pt without insurance. Referral made with [employer] for DME: rolling walker, 3 in 1/ bsc and W/C ... charity case. Approval pending...  11/02/2020 @ 1530 NCM received call from [employer] , pt unable to receive W/C 2/2 unable to produce credit card for their file, however, pt will receive BSC and rolling walker.  [facility]: [phone], tele# [phone].Pt without insurance , limited income , jobless.  RNCM will sign off for now as intervention is no longer needed. Please consult [place] again if new needs arise.  Final next level of care: Home/Self Care Barriers to Discharge: No Barriers Identified   Patient Goals and CMS Choice     Choice offered to / list presented to : Patient  Discharge Placement                       Discharge Plan and Services                DME Arranged: Walker rolling, Community education officer wheelchair with seat cushion, 3-N-1 DME Agency: [employer] Date DME Agency Contacted: 11/03/19 Time DME Agency Contacted: 1511 Representative spoke with at DME Agency: Velna Hatchet            Social Determinants of Health (SDOH) Interventions     Readmission Risk Interventions No flowsheet data found.

## 2019-11-03 NOTE — Progress Notes (Signed)
Provided discharge education/instructions, all questions and concerns addressed, RW and BSC delivered to room, Pt not in distress. Pt to discharge home with belongings.

## 2019-11-03 NOTE — TOC CAGE-AID Note (Signed)
Transition of Care [facility]) - CAGE-AID Screening   Patient Details  Name: Veronica Le MRN: [id number] Date of Birth: 11-07-1975  Transition of Care [facility]) CM/SW Contact:    Emeterio Reeve, [place] Phone Number: 11/03/2019, 3:15 PM   Clinical Narrative:  CSW met with pt at bedside. CSW introduced self and explained her role at the hospital.  Pt repots social alcohol use. Pt denies substance use. PT did not need resources at this time.   CAGE-AID Screening:    Have You Ever Felt You Ought to Cut Down on Your Drinking or Drug Use?: No Have People Annoyed You By Critizing Your Drinking Or Drug Use?: No Have You Felt Bad Or Guilty About Your Drinking Or Drug Use?: No Have You Ever Had a Drink or Used Drugs First Thing In The Morning to Steady Your Nerves or to Get Rid of a Hangover?: No CAGE-AID Score: 0  Substance Abuse Education Offered: Yes    Blima Ledger, [place] Social Worker [phone]

## 2019-11-03 NOTE — Plan of Care (Signed)

## 2019-11-05 NOTE — Discharge Summary (Signed)
Physician Discharge Summary      Patient ID: Veronica Jamesomvuyo Stickley MRN: [id number] DOB/AGE: 44/09/1975 44 y.o.  Admit date: 11/01/2019 Discharge date: 11/05/2019  Admission Diagnoses:  Active Problems:   Ankle fracture, left   Discharge Diagnoses:  Same  Surgeries: Procedure(s): OPEN REDUCTION INTERNAL FIXATION (ORIF) LEFT ANKLE FRACTURE on 11/01/2019   Consultants: Treatment Team:  Cammy Copaean, Dory Demont Scott, MD  Discharged Condition: [facility]  Hospital Course: Veronica Le is an 44 y.o. female who was admitted 11/01/2019 with a chief complaint of  Chief Complaint  Patient presents with  . Leg Pain  , and found to have a diagnosis of ankle fracture.  They were brought to the operating room on 11/01/2019 and underwent the above named procedures.  Patient tolerated the procedure well.  She worked on mobilization the next 2 days in the hospital.  She was discharged home on postop day #2 in good condition.  She will be sent home on aspirin for DVT prophylaxis as well as pain medicine and muscle relaxers for pain control.  Nonweightbearing with leg elevation encouraged.  We will see her back in 10 days for dressing change and suture removal.  Antibiotics given:  Anti-infectives (From admission, onward)   Start     Dose/Rate Route Frequency Ordered Stop   11/02/19 0600  ceFAZolin (ANCEF) IVPB 2g/100 mL premix        2 g 200 mL/hr over 30 Minutes Intravenous On call to O.R. 11/01/19 1649 11/02/19 0629   11/02/19 0500  ceFAZolin (ANCEF) IVPB 2g/100 mL premix        2 g 200 mL/hr over 30 Minutes Intravenous Every 8 hours 11/01/19 2249 11/02/19 2051   11/01/19 1955  vancomycin (VANCOCIN) powder  Status:  Discontinued          As needed 11/01/19 2016 11/01/19 2150    .  Recent vital signs:  Vitals:   11/03/19 0752 11/03/19 1427  BP: 102/70 116/65  Pulse: 73 65  Resp: 17 17  Temp: 99.1 F (37.3 C) 97.6 F (36.4 C)  SpO2: 100% 100%    Recent laboratory studies:  Results for orders  placed or performed during the hospital encounter of 11/01/19  Respiratory Panel by RT PCR (Flu A&B, Covid) - Nasopharyngeal Swab   Specimen: Nasopharyngeal Swab  Result Value Ref Range   SARS Coronavirus 2 by RT PCR NEGATIVE NEGATIVE   Influenza A by PCR NEGATIVE NEGATIVE   Influenza B by PCR NEGATIVE NEGATIVE  Comprehensive metabolic panel  Result Value Ref Range   Sodium 142 135 - 145 mmol/L   Potassium 3.7 3.5 - 5.1 mmol/L   Chloride 106 98 - 111 mmol/L   CO2 20 (L) 22 - 32 mmol/L   Glucose, Bld 76 70 - 99 mg/dL   BUN 14 6 - 20 mg/dL   Creatinine, Ser 3.240.65 0.44 - 1.00 mg/dL   Calcium 8.4 (L) 8.9 - 10.3 mg/dL   Total Protein 7.3 6.5 - 8.1 g/dL   Albumin 4.4 3.5 - 5.0 g/dL   AST 22 15 - 41 U/L   ALT 12 0 - 44 U/L   Alkaline Phosphatase 52 38 - 126 U/L   Total Bilirubin 0.3 0.3 - 1.2 mg/dL   GFR, Estimated >40>60 >10>60 mL/min   Anion gap 16 (H) 5 - 15  Ethanol  Result Value Ref Range   Alcohol, Ethyl (B) 266 (H) <10 mg/dL  CBC with Differential  Result Value Ref Range   WBC 6.4 4.0 - 10.5 K/uL  RBC 3.79 (L) 3.87 - 5.11 MIL/uL   Hemoglobin 12.9 12.0 - 15.0 g/dL   HCT 01.0 36 - 46 %   MCV 104.2 (H) 80.0 - 100.0 fL   MCH 34.0 26.0 - 34.0 pg   MCHC 32.7 30.0 - 36.0 g/dL   RDW 27.2 53.6 - 64.4 %   Platelets 275 150 - 400 K/uL   nRBC 0.0 0.0 - 0.2 %   Neutrophils Relative % 64 %   Neutro Abs 4.1 1.7 - 7.7 K/uL   Lymphocytes Relative 30 %   Lymphs Abs 1.9 0.7 - 4.0 K/uL   Monocytes Relative 5 %   Monocytes Absolute 0.3 0.1 - 1.0 K/uL   Eosinophils Relative 0 %   Eosinophils Absolute 0.0 0.0 - 0.5 K/uL   Basophils Relative 1 %   Basophils Absolute 0.0 0.0 - 0.1 K/uL   Immature Granulocytes 0 %   Abs Immature Granulocytes 0.01 0.00 - 0.07 K/uL  Protime-INR  Result Value Ref Range   Prothrombin Time 12.3 11.4 - 15.2 seconds   INR 1.0 0.8 - 1.2  Urinalysis, Routine w reflex microscopic  Result Value Ref Range   Color, Urine YELLOW YELLOW   APPearance CLEAR CLEAR    Specific Gravity, Urine 1.018 1.005 - 1.030   pH 5.0 5.0 - 8.0   Glucose, UA NEGATIVE NEGATIVE mg/dL   Hgb urine dipstick NEGATIVE NEGATIVE   Bilirubin Urine NEGATIVE NEGATIVE   Ketones, ur 20 (A) NEGATIVE mg/dL   Protein, ur NEGATIVE NEGATIVE mg/dL   Nitrite NEGATIVE NEGATIVE   Leukocytes,Ua NEGATIVE NEGATIVE  Urine rapid drug screen (hosp performed)  Result Value Ref Range   Opiates POSITIVE (A) NONE DETECTED   Cocaine NONE DETECTED NONE DETECTED   Benzodiazepines NONE DETECTED NONE DETECTED   Amphetamines NONE DETECTED NONE DETECTED   Tetrahydrocannabinol POSITIVE (A) NONE DETECTED   Barbiturates NONE DETECTED NONE DETECTED  Pregnancy, urine POC  Result Value Ref Range   Preg Test, Ur NEGATIVE NEGATIVE  Pregnancy, urine POC  Result Value Ref Range   Preg Test, Ur NEGATIVE NEGATIVE  Type and screen [facility]  Result Value Ref Range   ABO/RH(D) O POS    Antibody Screen NEG    Sample Expiration      11/04/2019,2359 Performed at [facility], [address], [place], [place] [address]     Discharge Medications:   Allergies as of 11/03/2019   No Known Allergies     Medication List    STOP taking these medications   benzocaine 10 % mucosal gel Commonly known as: ORAJEL     TAKE these medications   aspirin 81 MG chewable tablet Chew 1 tablet (81 mg total) by mouth 2 (two) times daily.   celecoxib 200 MG capsule Commonly known as: CELEBREX Take 1 capsule (200 mg total) by mouth 2 (two) times daily.   docusate sodium 100 MG capsule Commonly known as: COLACE Take 1 capsule (100 mg total) by mouth 2 (two) times daily.   methocarbamol 500 MG tablet Commonly known as: ROBAXIN Take 1 tablet (500 mg total) by mouth every 8 (eight) hours as needed for muscle spasms.   oxyCODONE 5 MG immediate release tablet Commonly known as: Oxy IR/ROXICODONE Take 1-2 tablets (5-10 mg total) by mouth every 4 (four) hours as needed for moderate pain (pain  score 4-6).       Diagnostic Studies: DG Ankle 2 Views Left  Result Date: 11/01/2019 CLINICAL DATA:  Open reduction  and internal fixation of the left ankle. EXAM: LEFT ANKLE - 2 VIEW COMPARISON:  November 01, 2019 (2:19 p.m.) FINDINGS: A radiopaque fixation plate and screws are seen along the left lateral malleolus. Additional radiopaque fixation screws are seen within the left medial malleolus. There is no evidence of dislocation. There is moderate to marked severity diffuse soft tissue swelling with a soft tissue defect seen along the medial aspect of the left ankle. IMPRESSION: Status post open reduction and internal fixation of the left lateral malleolus and left medial malleolus. Electronically Signed   By: Aram Candela M.D.   On: 11/01/2019 21:20   DG Ankle Complete Left  Result Date: 11/01/2019 CLINICAL DATA:  Fall, left ankle deformity EXAM: LEFT ANKLE COMPLETE - 3+ VIEW; LEFT FOOT - COMPLETE 3+ VIEW COMPARISON:  None. FINDINGS: Acute transversely oriented fracture of the medial malleolus. Distal fracture fragment remains aligned with the medial talus. Oblique fracture through the distal fibular metaphysis. Lateral subluxation of the talus relative to the tibia without frank dislocation. No definite posterior malleolar fracture is identified. Subtalar alignment remains anatomic. No acute fractures identified within the foot. Diffuse soft tissue swelling about the ankle. IMPRESSION: 1. Unstable bimalleolar fracture of the left ankle with lateral subluxation of the talus relative to the tibial plafond. 2. No definite posterior malleolar fracture is seen. Electronically Signed   By: Duanne Guess D.O.   On: 11/01/2019 14:55   DG Ankle Left Port  Result Date: 11/01/2019 CLINICAL DATA:  Postoperative left ankle fracture EXAM: PORTABLE LEFT ANKLE - 2 VIEW COMPARISON:  11/01/2019 FINDINGS: Cast material obscures bone detail. There have been interval postoperative changes with plate and  screw fixation of the distal fibula and screw fixation of the medial malleolus. Near anatomic alignment of the tibia and fibula as well as of the ankle joint is demonstrated postoperatively. IMPRESSION: Interval postoperative changes with near anatomic alignment of the left ankle. Electronically Signed   By: Burman Nieves M.D.   On: 11/01/2019 22:13   DG Foot Complete Left  Result Date: 11/01/2019 CLINICAL DATA:  Fall, left ankle deformity EXAM: LEFT ANKLE COMPLETE - 3+ VIEW; LEFT FOOT - COMPLETE 3+ VIEW COMPARISON:  None. FINDINGS: Acute transversely oriented fracture of the medial malleolus. Distal fracture fragment remains aligned with the medial talus. Oblique fracture through the distal fibular metaphysis. Lateral subluxation of the talus relative to the tibia without frank dislocation. No definite posterior malleolar fracture is identified. Subtalar alignment remains anatomic. No acute fractures identified within the foot. Diffuse soft tissue swelling about the ankle. IMPRESSION: 1. Unstable bimalleolar fracture of the left ankle with lateral subluxation of the talus relative to the tibial plafond. 2. No definite posterior malleolar fracture is seen. Electronically Signed   By: Duanne Guess D.O.   On: 11/01/2019 14:55    Disposition: Discharge disposition: 01-Home or Self Care       Discharge Instructions    Call MD / Call 911   Complete by: As directed    If you experience chest pain or shortness of breath, CALL 911 and be transported to the hospital emergency room.  If you develope a fever above 101 F, pus (white drainage) or increased drainage or redness at the wound, or calf pain, call your surgeon's office.   Constipation Prevention   Complete by: As directed    Drink plenty of fluids.  Prune juice may be helpful.  You may use a stool softener, such as Colace (over the counter) 100 mg twice  a day.  Use MiraLax (over the counter) for constipation as needed.   Diet - low sodium  heart healthy   Complete by: As directed    Discharge instructions   Complete by: As directed    Elevate left leg Nonweightbearing left leg Return to clinic in 10 days for suture removal   Increase activity slowly as tolerated   Complete by: As directed          Signed: Burnard Bunting 11/05/2019, 8:32 AM

## 2019-11-06 ENCOUNTER — Encounter (HOSPITAL_COMMUNITY): Payer: Self-pay | Admitting: Orthopedic Surgery

## 2019-11-08 ENCOUNTER — Encounter (HOSPITAL_COMMUNITY): Payer: Self-pay | Admitting: Orthopedic Surgery

## 2019-11-17 ENCOUNTER — Inpatient Hospital Stay: Payer: Self-pay | Admitting: Surgical

## 2019-11-17 ENCOUNTER — Telehealth: Payer: Self-pay

## 2019-11-17 NOTE — Telephone Encounter (Signed)
Per luke called and moved her appointment up for Wednesday @ 2:30

## 2019-11-17 NOTE — Telephone Encounter (Signed)
Patient called in to re sch appt , also wanted to ask will she be getting her boot off for her follow up on 11/12

## 2019-11-22 ENCOUNTER — Other Ambulatory Visit: Payer: Self-pay

## 2019-11-22 ENCOUNTER — Ambulatory Visit (INDEPENDENT_AMBULATORY_CARE_PROVIDER_SITE_OTHER): Payer: Self-pay

## 2019-11-22 ENCOUNTER — Ambulatory Visit (INDEPENDENT_AMBULATORY_CARE_PROVIDER_SITE_OTHER): Payer: Self-pay | Admitting: Orthopedic Surgery

## 2019-11-22 ENCOUNTER — Other Ambulatory Visit: Payer: Self-pay | Admitting: Surgical

## 2019-11-22 DIAGNOSIS — S82892A Other fracture of left lower leg, initial encounter for closed fracture: Secondary | ICD-10-CM

## 2019-11-22 MED ORDER — METHOCARBAMOL 500 MG PO TABS: 500.0000 mg | ORAL_TABLET | Freq: Three times a day (TID) | ORAL | 0 refills | Status: DC | PRN

## 2019-11-22 MED ORDER — OXYCODONE HCL 5 MG PO TABS: 5.0000 mg | ORAL_TABLET | Freq: Three times a day (TID) | ORAL | 0 refills | Status: DC | PRN

## 2019-11-24 ENCOUNTER — Inpatient Hospital Stay: Payer: Self-pay | Admitting: Surgical

## 2019-11-25 ENCOUNTER — Encounter: Payer: Self-pay | Admitting: Orthopedic Surgery

## 2019-11-25 NOTE — Progress Notes (Signed)
   Post-Op Visit Note   Patient: Veronica Le           Date of Birth: 08-31-1975           MRN: [id number] Visit Date: 11/22/2019 PCP: Patient, No Pcp Per   Assessment & Plan:  Chief Complaint:  Chief Complaint  Patient presents with  . Left Ankle - Routine Post Op   Visit Diagnoses:  1. Closed fracture of left ankle, initial encounter     Plan: [name] is a patient with left ankle pain.  Underwent open reduction internal fixation 3 weeks ago of bimalleolar ankle fracture.  On exam the incisions are intact she has actually good ankle range of motion.  Sutures discontinued Steri-Strips applied oxycodone muscle relaxer refilled.  Continue with aspirin for DVT prophylaxis.  Negative Homans no calf tenderness today.  300 ankle range of motion exercises encouraged today and okay to weight-bear as tolerated in fracture boot.  3-week return with plain radiographs and likely release at that time.  I would say if she has limitation of motion we could consider sending her to therapy but at this point I think it is unlikely she will needed based on her motion today.  Follow-Up Instructions: No follow-ups on file.   Orders:  Orders Placed This Encounter  Procedures  . XR Ankle Complete Left   No orders of the defined types were placed in this encounter.   Imaging: No results found.  PMFS History: Patient Active Problem List   Diagnosis Date Noted  . Ankle fracture, left 11/01/2019  . AMA (advanced maternal age) multigravida 36+, third trimester 12/02/2016   Past Medical History:  Diagnosis Date  . Medical history non-contributory     No family history on file.  Past Surgical History:  Procedure Laterality Date  . NO PAST SURGERIES    . ORIF ANKLE FRACTURE Left 11/01/2019   Procedure: OPEN REDUCTION INTERNAL FIXATION (ORIF) LEFT ANKLE FRACTURE;  Surgeon: Cammy Copa, MD;  Location: [facility] OR;  Service: Orthopedics;  Laterality: Left;   Social History   Occupational  History  . Not on file  Tobacco Use  . Smoking status: Never Smoker  . Smokeless tobacco: Never Used  Substance and Sexual Activity  . Alcohol use: Yes  . Drug use: No  . Sexual activity: Yes    Birth control/protection: None

## 2019-11-27 ENCOUNTER — Other Ambulatory Visit: Payer: Self-pay | Admitting: Orthopedic Surgery

## 2019-11-27 NOTE — Telephone Encounter (Signed)
Please advise. Thanks.  

## 2019-12-13 ENCOUNTER — Ambulatory Visit: Payer: Self-pay | Admitting: Orthopedic Surgery

## 2019-12-22 ENCOUNTER — Encounter: Payer: Self-pay | Admitting: Surgical

## 2019-12-22 ENCOUNTER — Ambulatory Visit (INDEPENDENT_AMBULATORY_CARE_PROVIDER_SITE_OTHER): Payer: Self-pay | Admitting: Surgical

## 2019-12-22 ENCOUNTER — Ambulatory Visit (INDEPENDENT_AMBULATORY_CARE_PROVIDER_SITE_OTHER): Payer: Self-pay

## 2019-12-22 DIAGNOSIS — S82892A Other fracture of left lower leg, initial encounter for closed fracture: Secondary | ICD-10-CM

## 2019-12-31 NOTE — Progress Notes (Signed)
   Post-Op Visit Note   Patient: Veronica Le           Date of Birth: 04/05/1975           MRN: [id number] Visit Date: 12/22/2019 PCP: Patient, No Pcp Per   Assessment & Plan:  Chief Complaint:  Chief Complaint  Patient presents with  . Left Ankle - Follow-up    11/01/2019 left ankle ORIF   Visit Diagnoses:  1. Closed fracture of left ankle, initial encounter     Plan: Patient is a 44 year old female presents s/p left ankle ORIF on 11/01/2019.  She continues to make improvements.  She is ambulating with a cam boot without any need for crutches.  No significant pain with ambulation in the boot.  Not had to take anything for pain in over a week.  On exam she has excellent dorsiflexion with range of motion to about 5 degrees past neutral.  Syndesmosis is stable.  Incisions are healing well with no evidence of infection.  No calf tenderness.  Negative Homans' sign.  Left ankle fractures with interval healing on radiographs today.  Overall she is progressing well.  Plan to transition out of the boot into a regular shoe with no weightbearing restriction.  Follow-up in 4 weeks for final check with Dr. August Saucer.  Follow-Up Instructions: No follow-ups on file.   Orders:  Orders Placed This Encounter  Procedures  . XR Ankle Complete Left   No orders of the defined types were placed in this encounter.   Imaging: No results found.  PMFS History: Patient Active Problem List   Diagnosis Date Noted  . Ankle fracture, left 11/01/2019  . AMA (advanced maternal age) multigravida 36+, third trimester 12/02/2016   Past Medical History:  Diagnosis Date  . Medical history non-contributory     No family history on file.  Past Surgical History:  Procedure Laterality Date  . NO PAST SURGERIES    . ORIF ANKLE FRACTURE Left 11/01/2019   Procedure: OPEN REDUCTION INTERNAL FIXATION (ORIF) LEFT ANKLE FRACTURE;  Surgeon: Cammy Copa, MD;  Location: [facility] OR;  Service: Orthopedics;  Laterality:  Left;   Social History   Occupational History  . Not on file  Tobacco Use  . Smoking status: Never Smoker  . Smokeless tobacco: Never Used  Substance and Sexual Activity  . Alcohol use: Yes  . Drug use: No  . Sexual activity: Yes    Birth control/protection: None

## 2020-01-19 ENCOUNTER — Ambulatory Visit: Payer: Self-pay | Admitting: Orthopedic Surgery

## 2020-01-24 ENCOUNTER — Ambulatory Visit: Payer: Self-pay | Admitting: Orthopedic Surgery

## 2020-02-26 ENCOUNTER — Telehealth: Payer: Self-pay

## 2020-02-26 NOTE — Telephone Encounter (Signed)
Patient called she stated in her last appointment luke told her everything was looking good she has had f/u appointments but had to cancel due to transportation she would like a call back from Dr.Dean regarding if she needs to reschedule the appointements back:[phone]

## 2020-02-26 NOTE — Telephone Encounter (Signed)
Does patient need to return?

## 2020-02-26 NOTE — Telephone Encounter (Signed)
Looked good at last appointment but needs one more appointment with Dr. August Saucer for final check before release

## 2020-02-27 NOTE — Telephone Encounter (Signed)
IC rescheduled appt for 03/02

## 2020-03-08 ENCOUNTER — Telehealth: Payer: Self-pay | Admitting: Orthopedic Surgery

## 2020-03-08 NOTE — Telephone Encounter (Signed)
Spoke with patient at length.

## 2020-03-08 NOTE — Telephone Encounter (Signed)
Pt called and states that she has a lot of questions for Lauren and would like for her to call! She is just worried about it being her last appt! She wants to know if she will ever be able to get the screws out in the future.   She is so grateful for everyone here and scared to be by herself from this point forward!   CB [phone]

## 2020-03-13 ENCOUNTER — Ambulatory Visit: Payer: Self-pay | Admitting: Orthopedic Surgery

## 2020-03-14 ENCOUNTER — Ambulatory Visit: Payer: Self-pay | Admitting: Orthopedic Surgery

## 2020-04-01 ENCOUNTER — Ambulatory Visit (INDEPENDENT_AMBULATORY_CARE_PROVIDER_SITE_OTHER): Payer: Self-pay | Admitting: Orthopedic Surgery

## 2020-04-01 ENCOUNTER — Other Ambulatory Visit: Payer: Self-pay

## 2020-04-01 DIAGNOSIS — S82892A Other fracture of left lower leg, initial encounter for closed fracture: Secondary | ICD-10-CM

## 2020-04-02 ENCOUNTER — Encounter: Payer: Self-pay | Admitting: Orthopedic Surgery

## 2020-04-02 NOTE — Progress Notes (Signed)
   Post-Op Visit Note   Patient: Veronica Le           Date of Birth: Feb 05, 1975           MRN: [id number] Visit Date: 04/01/2020 PCP: Patient, No Pcp Per   Assessment & Plan:  Chief Complaint:  Chief Complaint  Patient presents with  . Left Ankle - Follow-up   Visit Diagnoses:  1. Closed fracture of left ankle, initial encounter     Plan: nyS presents for follow-up of left ankle open reduction internal fixation.  She has been doing well.  Full weightbearing in a fracture boot.  On exam she has excellent ankle range of motion and minimal swelling.  Syndesmosis stable.  Encouraged her to go into regular shoes.  No running or loadbearing type exercises.  I think she is okay to dance and move normally but I do not think it is a great idea for her to do any running.  She asked about hardware removal but do not anticipate that to be required unless become symptomatic and would not do that for least 1 year after surgery.  Follow-up as needed.  Follow-Up Instructions: Return if symptoms worsen or fail to improve.   Orders:  No orders of the defined types were placed in this encounter.  No orders of the defined types were placed in this encounter.   Imaging: No results found.  PMFS History: Patient Active Problem List   Diagnosis Date Noted  . Ankle fracture, left 11/01/2019  . AMA (advanced maternal age) multigravida 36+, third trimester 12/02/2016   Past Medical History:  Diagnosis Date  . Medical history non-contributory     History reviewed. No pertinent family history.  Past Surgical History:  Procedure Laterality Date  . NO PAST SURGERIES    . ORIF ANKLE FRACTURE Left 11/01/2019   Procedure: OPEN REDUCTION INTERNAL FIXATION (ORIF) LEFT ANKLE FRACTURE;  Surgeon: Cammy Copa, MD;  Location: [facility] OR;  Service: Orthopedics;  Laterality: Left;   Social History   Occupational History  . Not on file  Tobacco Use  . Smoking status: Never Smoker  . Smokeless  tobacco: Never Used  Substance and Sexual Activity  . Alcohol use: Yes  . Drug use: No  . Sexual activity: Yes    Birth control/protection: None

## 2020-06-15 ENCOUNTER — Encounter (HOSPITAL_COMMUNITY): Payer: Self-pay | Admitting: Emergency Medicine

## 2020-06-15 ENCOUNTER — Other Ambulatory Visit: Payer: Self-pay

## 2020-06-15 ENCOUNTER — Emergency Department (HOSPITAL_COMMUNITY)
Admission: EM | Admit: 2020-06-15 | Discharge: 2020-06-16 | Disposition: A | Discharge status: Home / Self Care | Payer: Self-pay | Attending: Emergency Medicine | Admitting: Emergency Medicine

## 2020-06-15 DIAGNOSIS — F410 Panic disorder [episodic paroxysmal anxiety] without agoraphobia: Secondary | ICD-10-CM | POA: Insufficient documentation

## 2020-06-15 DIAGNOSIS — R259 Unspecified abnormal involuntary movements: Secondary | ICD-10-CM | POA: Insufficient documentation

## 2020-06-15 DIAGNOSIS — H9319 Tinnitus, unspecified ear: Secondary | ICD-10-CM | POA: Insufficient documentation

## 2020-06-15 DIAGNOSIS — F10230 Alcohol dependence with withdrawal, uncomplicated: Secondary | ICD-10-CM | POA: Insufficient documentation

## 2020-06-15 DIAGNOSIS — M791 Myalgia, unspecified site: Secondary | ICD-10-CM | POA: Insufficient documentation

## 2020-06-15 DIAGNOSIS — R519 Headache, unspecified: Secondary | ICD-10-CM | POA: Insufficient documentation

## 2020-06-15 DIAGNOSIS — F10930 Alcohol use, unspecified with withdrawal, uncomplicated: Secondary | ICD-10-CM

## 2020-06-15 DIAGNOSIS — K0889 Other specified disorders of teeth and supporting structures: Secondary | ICD-10-CM | POA: Insufficient documentation

## 2020-06-15 DIAGNOSIS — R42 Dizziness and giddiness: Secondary | ICD-10-CM | POA: Insufficient documentation

## 2020-06-15 DIAGNOSIS — R55 Syncope and collapse: Secondary | ICD-10-CM | POA: Insufficient documentation

## 2020-06-15 HISTORY — DX: Panic disorder (episodic paroxysmal anxiety): F41.0

## 2020-06-15 LAB — TSH: TSH: 1.047 u[IU]/mL (ref 0.350–4.500)

## 2020-06-15 LAB — CBC WITH DIFFERENTIAL/PLATELET
Abs Immature Granulocytes: 0.01 10*3/uL (ref 0.00–0.07)
Basophils Absolute: 0 10*3/uL (ref 0.0–0.1)
Basophils Relative: 1 %
Eosinophils Absolute: 0 10*3/uL (ref 0.0–0.5)
Eosinophils Relative: 0 %
HCT: 37.7 % (ref 36.0–46.0)
Hemoglobin: 12.8 g/dL (ref 12.0–15.0)
Immature Granulocytes: 0 %
Lymphocytes Relative: 26 %
Lymphs Abs: 0.8 10*3/uL (ref 0.7–4.0)
MCH: 35.6 pg — ABNORMAL HIGH (ref 26.0–34.0)
MCHC: 34 g/dL (ref 30.0–36.0)
MCV: 104.7 fL — ABNORMAL HIGH (ref 80.0–100.0)
Monocytes Absolute: 0.4 10*3/uL (ref 0.1–1.0)
Monocytes Relative: 12 %
Neutro Abs: 2 10*3/uL (ref 1.7–7.7)
Neutrophils Relative %: 61 %
Platelets: 205 10*3/uL (ref 150–400)
RBC: 3.6 MIL/uL — ABNORMAL LOW (ref 3.87–5.11)
RDW: 11.6 % (ref 11.5–15.5)
WBC: 3.2 10*3/uL — ABNORMAL LOW (ref 4.0–10.5)
nRBC: 0 % (ref 0.0–0.2)

## 2020-06-15 LAB — COMPREHENSIVE METABOLIC PANEL
ALT: 35 U/L (ref 0–44)
AST: 58 U/L — ABNORMAL HIGH (ref 15–41)
Albumin: 4.3 g/dL (ref 3.5–5.0)
Alkaline Phosphatase: 60 U/L (ref 38–126)
Anion gap: 11 (ref 5–15)
BUN: <5 mg/dL — ABNORMAL LOW (ref 6–20)
CO2: 25 mmol/L (ref 22–32)
Calcium: 9.5 mg/dL (ref 8.9–10.3)
Chloride: 100 mmol/L (ref 98–111)
Creatinine, Ser: 0.51 mg/dL (ref 0.44–1.00)
GFR, Estimated: >60 mL/min (ref >60)
Glucose, Bld: 98 mg/dL (ref 70–99)
Potassium: 3.5 mmol/L (ref 3.5–5.1)
Sodium: 136 mmol/L (ref 135–145)
Total Bilirubin: 1.1 mg/dL (ref 0.3–1.2)
Total Protein: 7.7 g/dL (ref 6.5–8.1)

## 2020-06-15 LAB — I-STAT BETA HCG BLOOD, ED (MC, WL, AP ONLY): I-stat hCG, quantitative: <5 m[IU]/mL (ref <5)

## 2020-06-15 LAB — MAGNESIUM: Magnesium: 1.7 mg/dL (ref 1.7–2.4)

## 2020-06-15 LAB — LIPASE, BLOOD: Lipase: 26 U/L (ref 11–51)

## 2020-06-15 NOTE — ED Triage Notes (Signed)
Pt reports dry heaves this morning, increased panic attacks over the past few weeks with bilateral fingers tingling, being under a lot of stress, near syncope today, vomited x1 today, and increased BMs (soft) today.   Denies diarrhea.

## 2020-06-15 NOTE — ED Provider Notes (Signed)
Emergency Medicine Provider Triage Evaluation Note  Veronica Le , a 45 y.o. female  was evaluated in triage.  Pt complains of near syncope, increased panic attacks, emesis x 1 and increased bowel movements today that were soft x 1 day. Patient has been having panic attacks at least twice a week for x 2 months. Stopped drinking heavily x 1 week ago, smokes marijuana.   Review of Systems  Positive: Emesis, nausea, anxiety, palpitations Negative: Fever, chills, chest pain, suicidal or homicidal ideations  Physical Exam  BP (!) 142/76 (BP Location: Left Arm)   Pulse 62   Temp 99.1 F (37.3 C) (Oral)   Resp 17   SpO2 100%  Gen:   Awake, no distress   Resp:  Normal effort  MSK:   Moves extremities without difficulty  Other:  No abdominal tenderness  Medical Decision Making  Medically screening exam initiated at 6:17 PM.  Appropriate orders placed.  Veronica Le was informed that the remainder of the evaluation will be completed by another provider, this initial triage assessment does not replace that evaluation, and the importance of remaining in the ED until their evaluation is complete.  Labs ordered as well as UDS and EKG.    Portions of this note were generated with Scientist, clinical (histocompatibility and immunogenetics). Dictation errors may occur despite best attempts at proofreading.    Veronica Ace, PA-C 06/15/20 1823    Veronica Rhine, MD 06/16/20 [phone]

## 2020-06-16 MED ORDER — LORAZEPAM 1 MG PO TABS: ORAL_TABLET | ORAL | 0 refills | Status: DC

## 2020-06-16 MED ORDER — LORAZEPAM 1 MG PO TABS
1.0000 mg | ORAL_TABLET | Freq: Once | ORAL | Status: AC
  Administered 2020-06-16: 1 mg via ORAL
  Filled 2020-06-16: qty 1

## 2020-06-16 NOTE — ED Provider Notes (Signed)
[facility] EMERGENCY DEPARTMENT Provider Note   CSN: [id number] Arrival date & time: 06/15/20  1657     History Chief Complaint  Patient presents with  . Near Syncope  . Panic Attack    Veronica Le is a 45 y.o. female.  HPI    Patient history of panic attacks and alcohol abuse presents with multiple complaints.  Patient reports she used to drink wine every day for years.  She abruptly stopped 2 days ago.  She reports she has been having increased dizziness, tinnitus, and muscle cramps.  She also reports dental pain.  She is also had some headaches.  She reports at one point she felt lightheaded, she sat down and fell asleep immediately.  She thinks this may be due to stopping alcohol.  She also mentioned to the PA that she has had increasing bowel movements. While she has been in the emergency department she is starting to feel improved. She also reports increasing panic attacks She reports feeling tremulous Past Medical History:  Diagnosis Date  . Medical history non-contributory   . Panic attack     Patient Active Problem List   Diagnosis Date Noted  . Ankle fracture, left 11/01/2019  . AMA (advanced maternal age) multigravida 36+, third trimester 12/02/2016    Past Surgical History:  Procedure Laterality Date  . NO PAST SURGERIES    . ORIF ANKLE FRACTURE Left 11/01/2019   Procedure: OPEN REDUCTION INTERNAL FIXATION (ORIF) LEFT ANKLE FRACTURE;  Surgeon: Cammy Copa, MD;  Location: [facility] OR;  Service: Orthopedics;  Laterality: Left;     OB History    Gravida  9   Para  4   Term  4   Preterm      AB  5   Living  4     SAB      IAB  5   Ectopic      Multiple  0   Live Births  1           No family history on file.  Social History   Tobacco Use  . Smoking status: Never Smoker  . Smokeless tobacco: Never Used  Substance Use Topics  . Alcohol use: Yes  . Drug use: Yes    Types: Marijuana    Home  Medications Prior to Admission medications   Medication Sig Start Date End Date Taking? Authorizing Provider  LORazepam (ATIVAN) 1 MG tablet Take 3 tablets PO on day one, take 2 tablets PO on day two, take 1 tablet PO on day one then STOP 06/16/20  Yes Zadie Rhine, MD  celecoxib (CELEBREX) 200 MG capsule Take 1 capsule (200 mg total) by mouth 2 (two) times daily. 11/03/19   Cammy Copa, MD  [employer] ASPIRIN ADULT LOW DOSE 81 MG chewable tablet CHEW 1 TABLET (81 MG TOTAL) BY MOUTH 2 (TWO) TIMES DAILY. 11/28/19   Magnant, Joycie Peek, PA-C    Allergies    Patient has no known allergies.  Review of Systems   Review of Systems  Constitutional: Negative for fever.  HENT: Positive for dental problem and tinnitus.   Gastrointestinal: Positive for nausea.  Neurological: Positive for dizziness.       Tremulous  Psychiatric/Behavioral: The patient is nervous/anxious.   All other systems reviewed and are negative.   Physical Exam Updated Vital Signs BP (!) 158/91   Pulse 68   Temp 98.1 F (36.7 C) (Oral)   Resp 16   Ht 1.651 m (  5\' 5" )   Wt 72.6 kg   LMP 06/08/2020   SpO2 100%   BMI 26.63 kg/m   Physical Exam  CONSTITUTIONAL: Well developed/well nourished, resting comfortably HEAD: Normocephalic/atraumatic EYES: EOMI/PERRL ENMT: Mucous membranes moist, no obvious facial swelling or dental infection.  Bilateral TMs occluded by cerumen NECK: supple no meningeal signs SPINE/BACK:entire spine nontender CV: S1/S2 noted, no murmurs/rubs/gallops noted LUNGS: Lungs are clear to auscultation bilaterally, no apparent distress ABDOMEN: soft, nontender, no rebound or guarding, bowel sounds noted throughout abdomen GU:no cva tenderness NEURO: Pt is awake/alert/appropriate, moves all extremitiesx4.  No facial droop.  No arm or leg drift.  No speech deficit.  No tremors noted EXTREMITIES: pulses normal/equal, full ROM SKIN: warm, color normal PSYCH: Mildly anxious  ED Results /  Procedures / Treatments   Labs (all labs ordered are listed, but only abnormal results are displayed) Labs Reviewed  COMPREHENSIVE METABOLIC PANEL - Abnormal; Notable for the following components:      Result Value   BUN <5 (*)    AST 58 (*)    All other components within normal limits  CBC WITH DIFFERENTIAL/PLATELET - Abnormal; Notable for the following components:   WBC 3.2 (*)    RBC 3.60 (*)    MCV 104.7 (*)    MCH 35.6 (*)    All other components within normal limits  LIPASE, BLOOD  MAGNESIUM  TSH  I-STAT BETA HCG BLOOD, ED ([facility], [facility], [facility] ONLY)    EKG EKG Interpretation  Date/Time:  Sunday June 16 2020 03:07:23 EDT Ventricular Rate:  65 PR Interval:  133 QRS Duration: 97 QT Interval:  462 QTC Calculation: 481 R Axis:   62 Text Interpretation: Sinus rhythm Atrial premature complex Abnormal R-wave progression, early transition Confirmed by Sufyaan Palma ([id number]) on 06/16/2020 4:53:14 AM   Radiology No results found.  Procedures Procedures   Medications Ordered in ED Medications  LORazepam (ATIVAN) tablet 1 mg (1 mg Oral Given 06/16/20 0519)    ED Course  I have reviewed the triage vital signs and the nursing notes.  Pertinent labs results that were available during my care of the patient were reviewed by me and considered in my medical decision making (see chart for details).    MDM Rules/Calculators/A&P                          Patient presents with multiple complaints.  This all seem to coincide with her stopping alcohol. She reported feeling tremulous, anxious, muscle cramps and near syncope. Strong suspicion this represents alcohol withdrawal.  Patient is already feeling improved.  No seizures reported. She will be placed on a short course of Ativan taper.  She reports she is already following up with alcoholics anonymous. No signs of any acute neurologic compromise.  Labs are reassuring. Patient feels comfortable for discharge home Final Clinical  Impression(s) / ED Diagnoses Final diagnoses:  Alcohol withdrawal syndrome without complication (HCC)    Rx / DC Orders ED Discharge Orders         Ordered    LORazepam (ATIVAN) 1 MG tablet        06 /05/22 , MD 06/16/20 [phone]

## 2020-06-16 NOTE — Discharge Instructions (Addendum)
Substance Abuse Treatment Programs ° °Intensive Outpatient Programs °[facility]     °[address]      °[place], [place]                   °[phone]      ° °[facility] °[address] °[place], [place] °[phone] ° °[facility]     °(Inpatient and outpatient)     °[address]           °[phone]   ° °[facility] °[phone] (Suboxone and Methadone) ° °[address]      °[place], [place] [address]      °336-882-2125      ° °[address] °[place], [place] °852-3033 ° °[facility] (Outpatient/Inpatient, Chemical)    °(insurance only) [phone]      °       °[facility] (Groups & Residential) °[place], [place] °[phone] ° °   °[facility]     °[address]     °[place], [place]      °[phone]      ° °[facility] (for caregivers and family) °[address] °[place], [place] °336-299-4655 ° ° ° ° ° °Residential Treatment Programs °[facility]      °[address], [place], [place] [address]  °[phone]      ° °[facility] °[address] [place], [place] [address] °[phone] ° °[facility]        °336-248-8914      ° °[facility] °[phone] ° °[facility]             °[address]                                         °[place], [place]                                                °[phone] or 336-784-9470                              ° °[facility] [place] °[address] °[place], [address] °[phone] ° °[facility]    °[address]      °[place], [place]     °[phone]      ° °[facility] [place] [facility] °[address] °[place], [place] °336-285-9073 ° °[facility]   °[address]     °[place], [place] [address]     °[phone]      °Admissions: 8am-3pm M-F ° °[facility] °[address] °[place] °336-227-7417 ° °[facility]: Residential Program (90 Days)   °[place], [place]      °336-725-8389 or [phone]    ° °[facility] °[place], [place] °(Walk in Hours over the weekend or by referral) ° °[facility] °[address], [place],  [address] °([phone] ° °Crisis Mobile: [facility]:  1-877-626-1772 (for crisis response 24 hours a day) °[facility] Hotline:      1-800-256-2452 °Outpatient Psychiatry and Counseling ° °[facility]: Mobile Crisis   Management 24 hours:  1-877-626-1772 ° °[facility] sliding scale fee and walk in schedule: M-F 8am-12pm/1pm-3pm °[address]  °[place], [place] [address] °[phone] ° °[facility] °[address] °[place], [place] [address] °336-703-9650 ° °[facility] (Formerly known [facility])- new patient walk-in appointments available Monday - Friday 8am -3pm.          °[address] °[place], [place] [address] °336-676-6840 or crisis line- 336-676-6905 ° °[facility]/ Intensive Outpatient Therapy Program °[address] °[place], [place] [address] °336-832-9804 ° °[facility]                  °Crisis Services      °[phone]      °[address]     °[place], [place] [address]                ° °[facility]   °[facility] °[phone] °[address] °[place], [place] [address] ° ° °[facility]          °[address],  °[place], [place] [address]       °[phone] ° °[facility] °[address] °[place], [place] [address] °336-292-1510 ° °[facility]    °[address]    °[place], [place] [address]     °[phone]      ° °Parish McKinney, MD     °[address]     °[place] [address]     °336-282-1251     °  °[facility] °[address] °[place] [address] ° °[facility]     °[address]     °[place], [place]      °336-542-2076      ° °[facility] °Shamsher Ahluwalia, MD °[address] °[place], [place] [address] °[phone] ° °[facility]     °[address]     °[place], [place] [address]     °336-274-1237      ° °[facility] °[address] °[place], [place] [address] °336-854-4450 °Resources for Temporary Residential Assistance/Crisis Centers ° °DAY CENTERS °[facility] °M-F 8am-3pm   °[address] [place], [place] [address]   336-332-0824 °Services include: laundry, barbering, support groups, case management, phone  & computer access, showers, AA/NA mtgs, mental health/substance abuse nurse, job skills class, disability information, VA assistance, spiritual classes, etc.  ° °HOMELESS SHELTERS ° °[facility]     °[facility]   °[address], [place]     °[phone]       °       °[facility] (women and children)       °[address] °, [place] [address] °336-275-0820 °[email or web address] for application and process °Application Required ° °[facility]   °[address]    °[place] [address]     °[phone]       °             °[facility] °[address] °, [place] [address] °336.273.5572 °336-235-0363(schedule application appt.) °Application Required ° °[facility] (women only)    °[address]     °[place],  [address]     °336-884-1039      °  Intake starts 6pm daily °Need valid ID, SSC, & Police report °[facility] [place] °[address] °[place], [place] °336-881-5420 °Application Required ° °[facility] (men only)     °[address]      °[place], [place]     °[phone]      ° °[facility] °(Pregnant women only) °[address] °[place], [place] °336-275-0206 ° °[facility]      °[address]      °[place], [place] [address]     °[phone]      °       °[facility] °[address] °[place], [place] °336-723-1848 °90 day commitment/SA/Application process ° °[facility](men only)     °[address]     °[place], [place]     °[phone]       °Check-in at 7pm     °       °[facility] °[address] °[place], [place] [address] °336-248-6684 °Men/Women/Women and Children must be there by 7 pm ° °[facility] °[place], [place] °336-722-8721                ° °

## 2021-01-01 ENCOUNTER — Other Ambulatory Visit: Payer: Self-pay

## 2021-01-01 ENCOUNTER — Emergency Department (HOSPITAL_COMMUNITY)
Admission: EM | Admit: 2021-01-01 | Discharge: 2021-01-02 | Disposition: A | Discharge status: Home / Self Care | Payer: Self-pay | Attending: Emergency Medicine | Admitting: Emergency Medicine

## 2021-01-01 DIAGNOSIS — F410 Panic disorder [episodic paroxysmal anxiety] without agoraphobia: Secondary | ICD-10-CM

## 2021-01-01 DIAGNOSIS — F419 Anxiety disorder, unspecified: Secondary | ICD-10-CM | POA: Insufficient documentation

## 2021-01-01 NOTE — ED Triage Notes (Signed)
Pt arrived with EMS for hyperventilation. Pt reports having a hard time catching her breath earlier, denies any stressful events. Pt appears paranoid, denies SI/HI

## 2021-01-02 NOTE — Discharge Instructions (Addendum)
Return if development of any new or worsening symptoms 

## 2021-01-02 NOTE — ED Provider Notes (Signed)
[facility] EMERGENCY DEPARTMENT Provider Note   CSN: [id number] Arrival date & time: 01/01/21  2143     History Chief Complaint  Patient presents with   Anxiety    Jadence Nordling is a 45 y.o. female.  Patient with history of anxiety and panic attacks presents today with complaint of anxiety.  She states that same began after she heard a loud noise and saw police officers which triggered her to feel very anxious and began to hyperventilate.  She states that in that moment she was unable to control her breathing and therefore she called 911.  Of note, patient has been here over 10 hours waiting for room and upon my assessment she states that her symptoms have completely resolved and she has realized that she likely had a panic attack.  History of same in the past, states that this episode is very similar to previous panic attacks.  She denies any fevers, chills, chest pain, shortness of breath, nausea, vomiting, diarrhea.  The history is provided by the patient. No language interpreter was used.  Anxiety Pertinent negatives include no chest pain, no abdominal pain, no headaches and no shortness of breath.      Past Medical History:  Diagnosis Date   Medical history non-contributory    Panic attack     Patient Active Problem List   Diagnosis Date Noted   Ankle fracture, left 11/01/2019   AMA (advanced maternal age) multigravida 36+, third trimester 12/02/2016    Past Surgical History:  Procedure Laterality Date   NO PAST SURGERIES     ORIF ANKLE FRACTURE Left 11/01/2019   Procedure: OPEN REDUCTION INTERNAL FIXATION (ORIF) LEFT ANKLE FRACTURE;  Surgeon: Cammy Copa, MD;  Location: [facility];  Service: Orthopedics;  Laterality: Left;     OB History     Gravida  9   Para  4   Term  4   Preterm      AB  5   Living  4      SAB      IAB  5   Ectopic      Multiple  0   Live Births  1           No family history on  file.  Social History   Tobacco Use   Smoking status: Never   Smokeless tobacco: Never  Substance Use Topics   Alcohol use: Yes   Drug use: Yes    Types: Marijuana    Home Medications Prior to Admission medications   Medication Sig Start Date End Date Taking? Authorizing Provider  ibuprofen (ADVIL) 200 MG tablet Take 400 mg by mouth every 6 (six) hours as needed for moderate pain or headache.   Yes [provider]  Naphazoline-Glycerin (CLEAR EYES REDNESS RELIEF OP) Place 1 drop into both eyes 2 (two) times daily as needed (dr, irritated eyes).   Yes [provider]  celecoxib (CELEBREX) 200 MG capsule Take 1 capsule (200 mg total) by mouth 2 (two) times daily. Patient not taking: Reported on 01/02/2021 11/03/19   Cammy Copa, MD  [employer] ASPIRIN ADULT LOW DOSE 81 MG chewable tablet CHEW 1 TABLET (81 MG TOTAL) BY MOUTH 2 (TWO) TIMES DAILY. Patient not taking: Reported on 01/02/2021 11/28/19   Magnant, Joycie Peek, PA-C  LORazepam (ATIVAN) 1 MG tablet Take 3 tablets PO on day one, take 2 tablets PO on day two, take 1 tablet PO on day one then STOP Patient not  taking: Reported on 01/02/2021 06/16/20   Zadie Rhine, MD    Allergies    Patient has no known allergies.  Review of Systems   Review of Systems  Constitutional:  Negative for chills and fever.  Respiratory:  Negative for shortness of breath.   Cardiovascular:  Negative for chest pain.  Gastrointestinal:  Negative for abdominal pain, diarrhea, nausea and vomiting.  Neurological:  Negative for dizziness, tremors, seizures, syncope, facial asymmetry, speech difficulty, weakness, light-headedness, numbness and headaches.  Psychiatric/Behavioral:  Negative for confusion and decreased concentration.   All other systems reviewed and are negative.  Physical Exam Updated Vital Signs BP 138/87 (BP Location: Left Arm)    Pulse 91    Temp (!) 97.4 F (36.3 C) (Oral)    Resp 16    SpO2 100%   Physical  Exam Vitals and nursing note reviewed.  Constitutional:      General: She is not in acute distress.    Appearance: Normal appearance. She is normal weight. She is not ill-appearing, toxic-appearing or diaphoretic.     Comments: Patient standing in room talking on the phone in no distress.  HENT:     Head: Normocephalic and atraumatic.     Nose: Nose normal.     Mouth/Throat:     Mouth: Mucous membranes are moist.  Eyes:     Extraocular Movements: Extraocular movements intact.     Pupils: Pupils are equal, round, and reactive to light.  Cardiovascular:     Rate and Rhythm: Normal rate and regular rhythm.     Heart sounds: Normal heart sounds.  Pulmonary:     Effort: Pulmonary effort is normal. No respiratory distress.     Breath sounds: Normal breath sounds.  Abdominal:     General: Abdomen is flat.     Palpations: Abdomen is soft.     Tenderness: There is no abdominal tenderness.  Musculoskeletal:        General: Normal range of motion.     Cervical back: Normal range of motion.  Skin:    General: Skin is warm and dry.  Neurological:     General: No focal deficit present.     Mental Status: She is alert.  Psychiatric:        Mood and Affect: Mood normal.        Behavior: Behavior normal.    ED Results / Procedures / Treatments   Labs (all labs ordered are listed, but only abnormal results are displayed) Labs Reviewed - No data to display  EKG None  Radiology No results found.  Procedures Procedures   Medications Ordered in ED Medications - No data to display  ED Course  I have reviewed the triage vital signs and the nursing notes.  Pertinent labs & imaging results that were available during my care of the patient were reviewed by me and considered in my medical decision making (see chart for details).    MDM Rules/Calculators/A&P                         Patient is seen today after waiting in the lobby for 10 hours after episode of hyperventilation.  She  states that she was triggered by loud noises and seeing police officers.  She states that in that moment she was unable to control her breathing and therefore called 911.   Upon my assessment, patient states her symptoms have completely resolved.  She states that she has had similar  episodes like this in the past usually triggered by loud noises.  She states that she feels she likely had a panic attack and that her symptoms completely resolved soon after her arrival to the ER.  She also states that these episodes normally happen around the holidays as they are a stressful time for her.  She denies fevers, chills, chest pain, shortness of breath, abdominal pain, nausea, vomiting, diarrhea.  I am in agreement that patient likely had a panic attack.  She denies SI/HI, visual or auditory hallucinations.  She is stable for discharge at this time.  She is amenable with this plan.  Educated on red flag symptoms that would prompt immediate return.  Discharged in stable condition.   Final Clinical Impression(s) / ED Diagnoses Final diagnoses:  Anxiety attack    Rx / DC Orders ED Discharge Orders     None     An After Visit Summary was printed and given to the patient.    Vear Clock 01/02/21 7858    Arby Barrette, MD 01/07/21 [phone]

## 2021-01-02 NOTE — ED Notes (Signed)
Patient ambulating in hallway independently with steady gait. Patient calmly asked for assistance in making a phone call. Patient in no apparent distress at this time.

## 2021-09-26 IMAGING — CR DG FOOT COMPLETE 3+V*L*
3 series · 3 of 3 positions shown · non-contrast
Comparison: None.

CLINICAL DATA: Fall, left ankle deformity

EXAM:
LEFT ANKLE COMPLETE - 3+ VIEW; LEFT FOOT - COMPLETE 3+ VIEW

[foot ap]
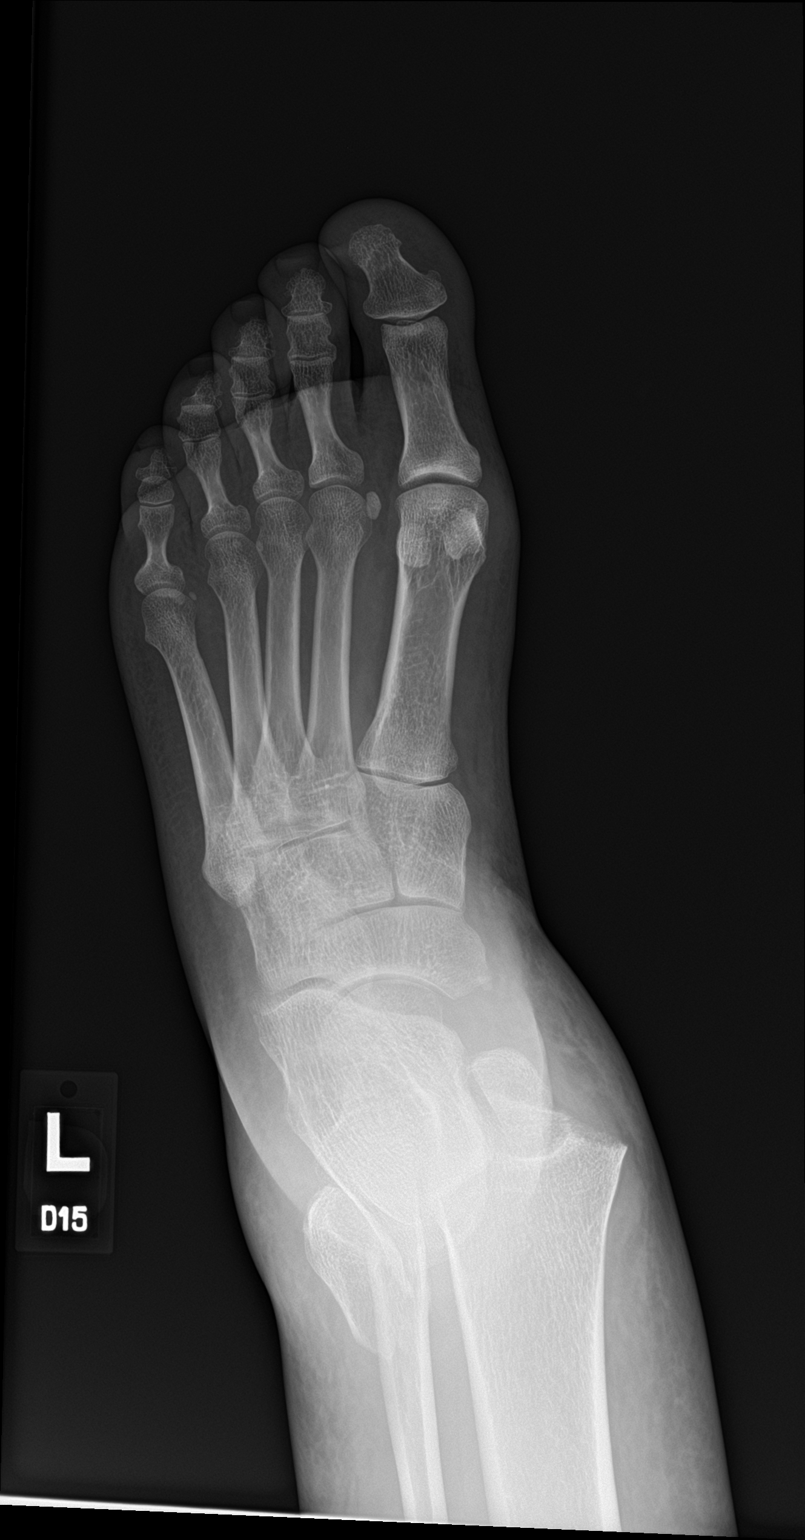

[foot obl]
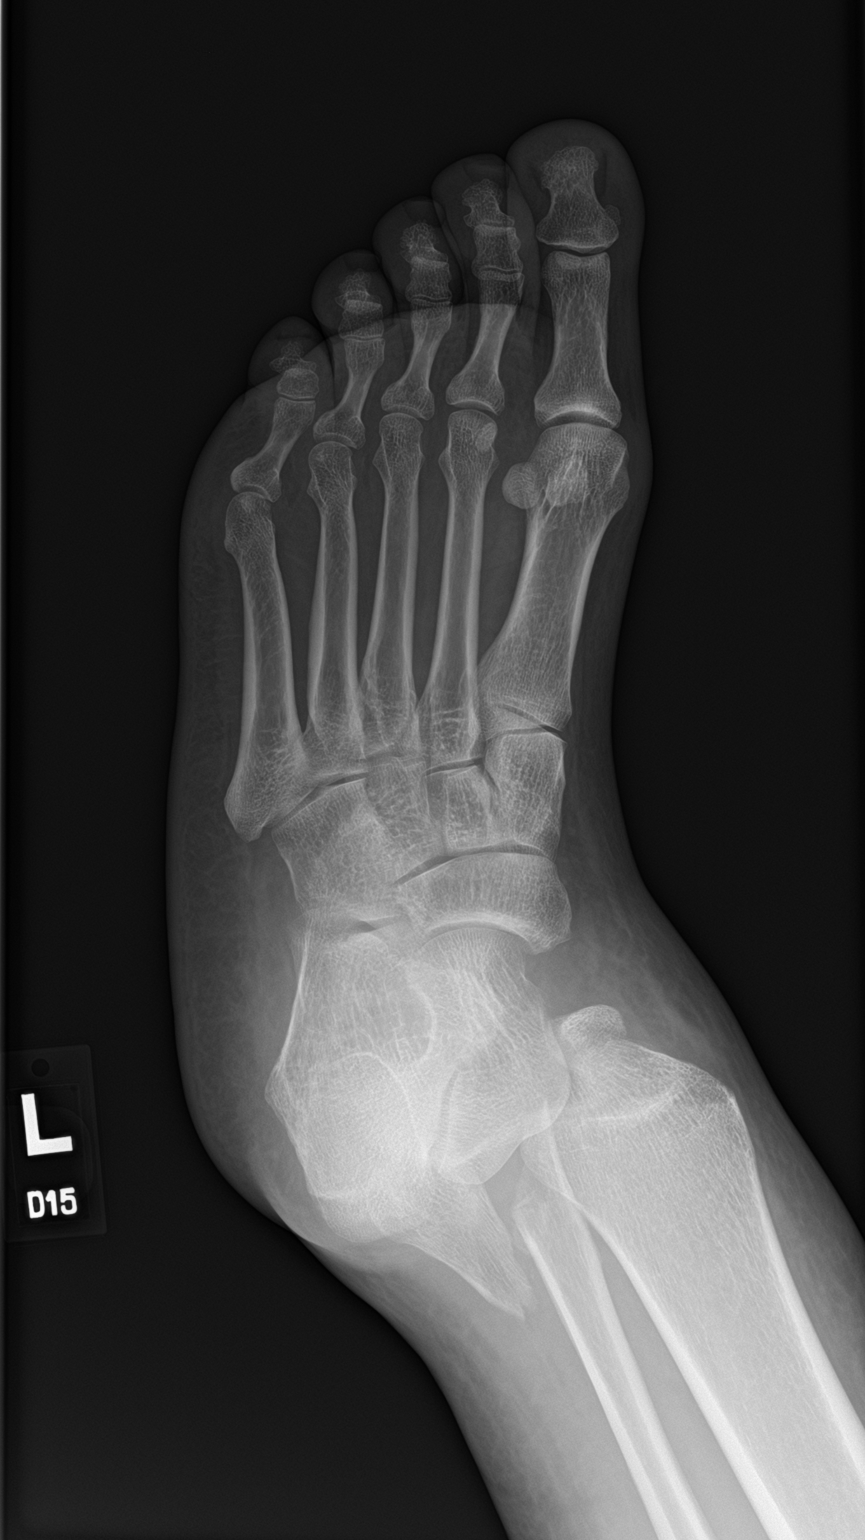

[foot lat]
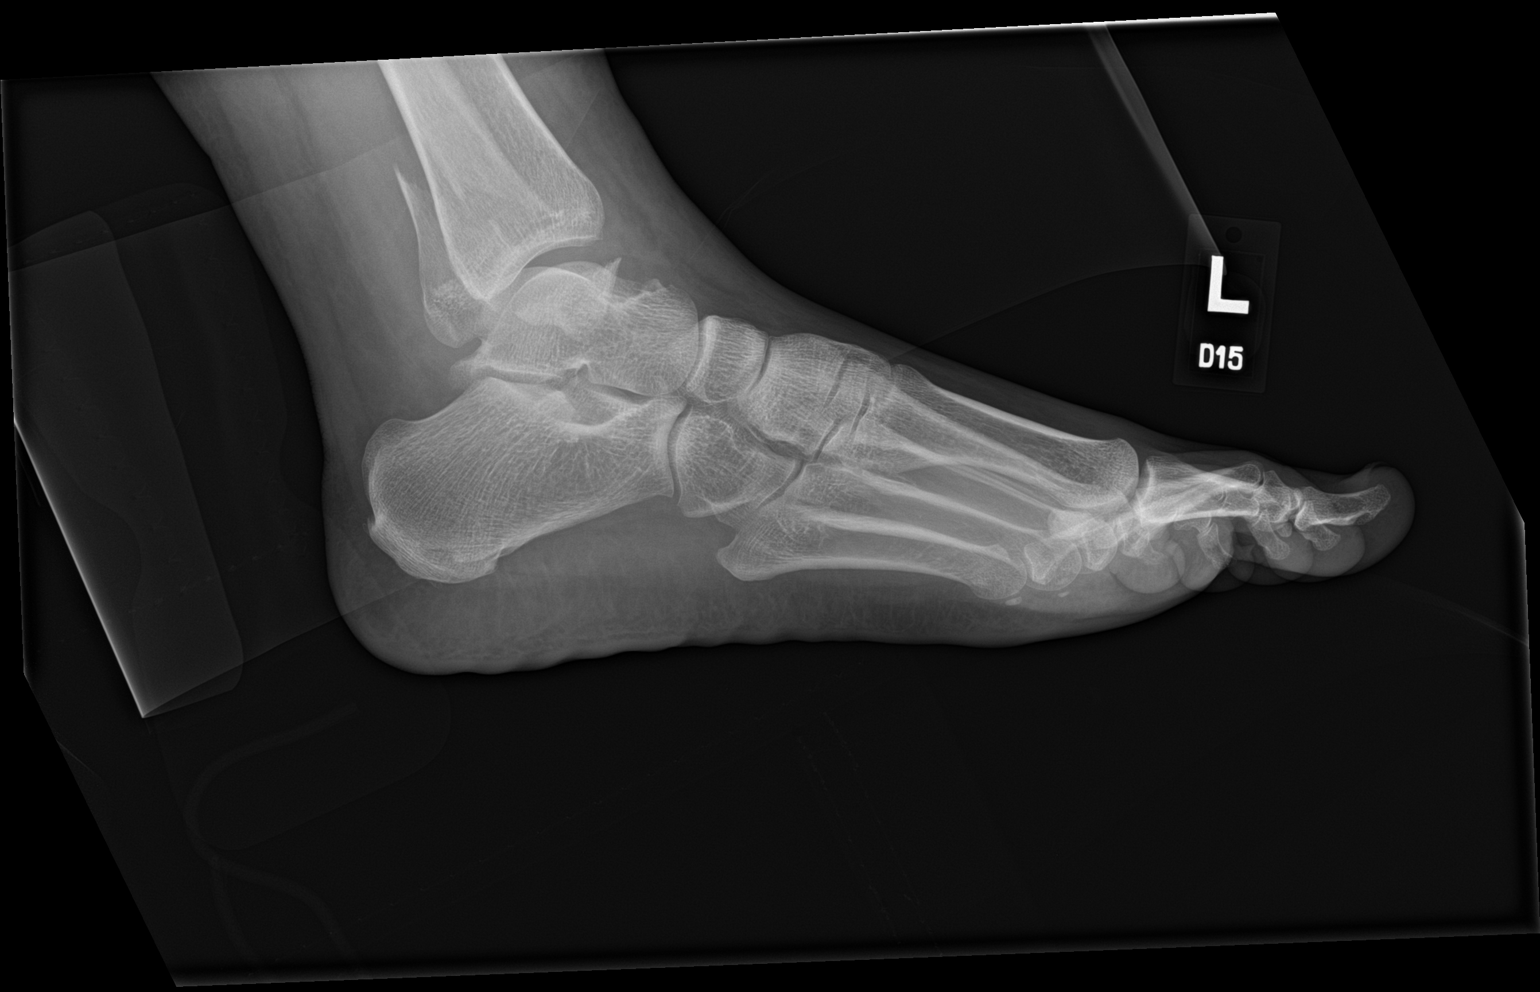

[3 of 3 positions shown; findings below may reference images not displayed]

FINDINGS: Acute transversely oriented fracture of the medial malleolus. Distal
fracture fragment remains aligned with the medial talus. Oblique
fracture through the distal fibular metaphysis. Lateral subluxation
of the talus relative to the tibia without frank dislocation. No
definite posterior malleolar fracture is identified. Subtalar
alignment remains anatomic. No acute fractures identified within the
foot. Diffuse soft tissue swelling about the ankle.
IMPRESSION: 1. Unstable bimalleolar fracture of the left ankle with lateral
subluxation of the talus relative to the tibial plafond.
2. No definite posterior malleolar fracture is seen.

## 2021-09-26 IMAGING — CR DG ANKLE COMPLETE 3+V*L*
3 series · 3 of 3 positions shown · non-contrast
Comparison: None.

CLINICAL DATA: Fall, left ankle deformity

EXAM:
LEFT ANKLE COMPLETE - 3+ VIEW; LEFT FOOT - COMPLETE 3+ VIEW

[ankle ap]
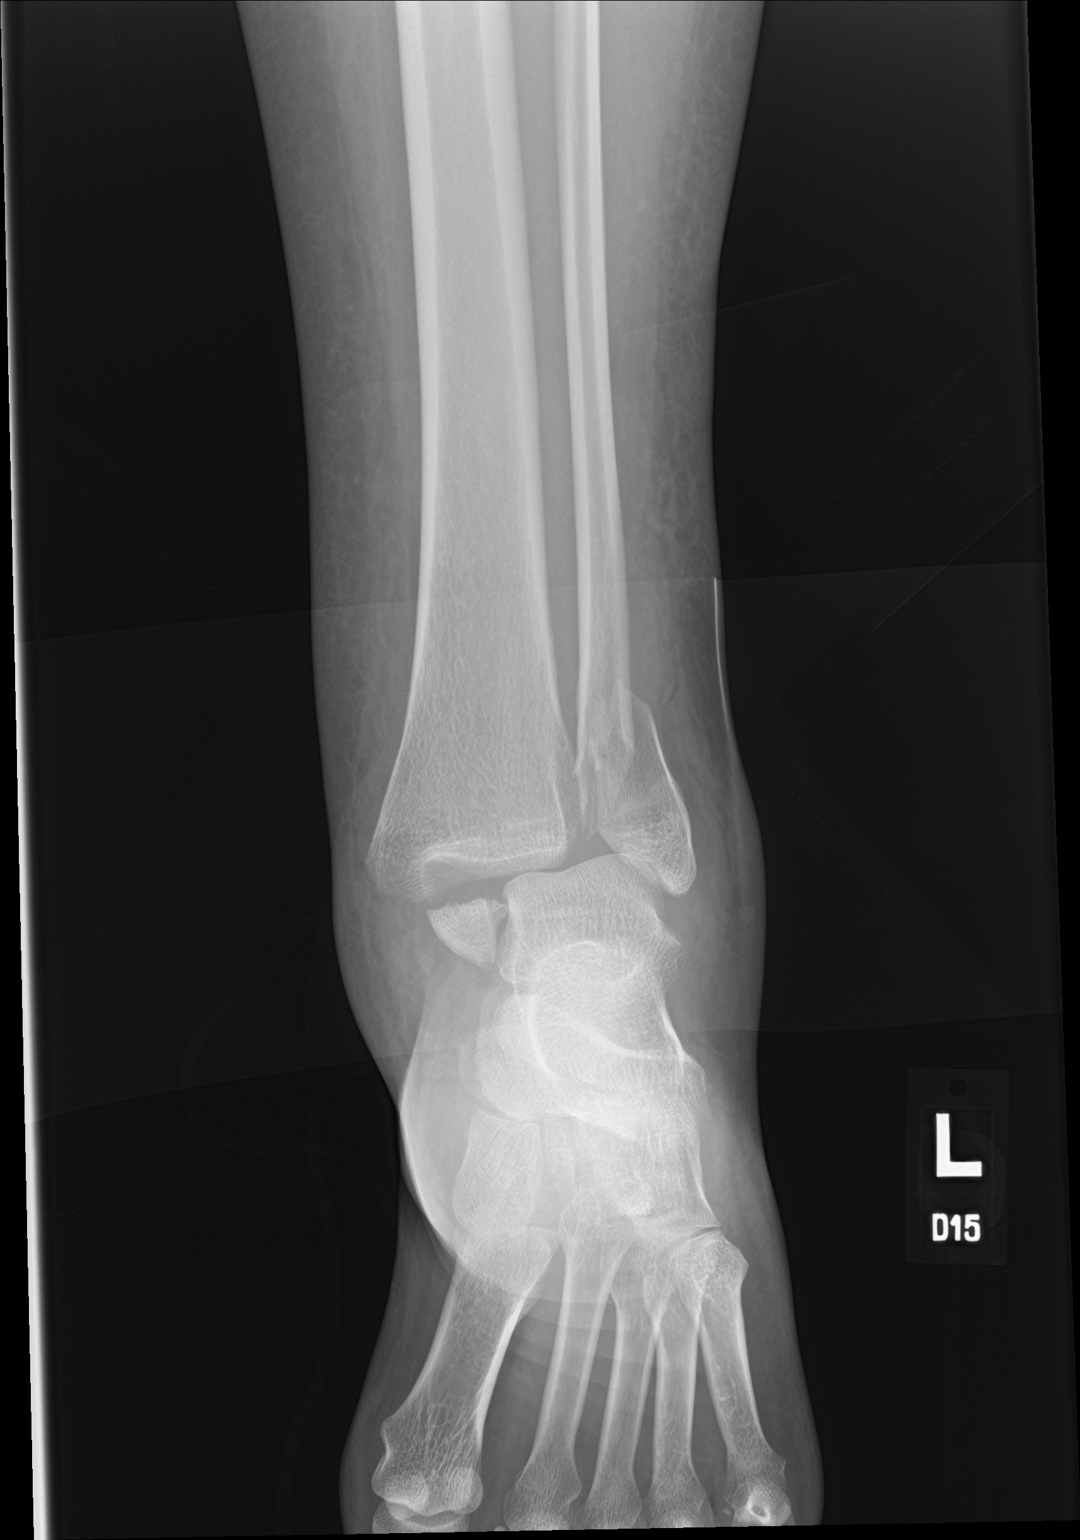

[ankle obl]
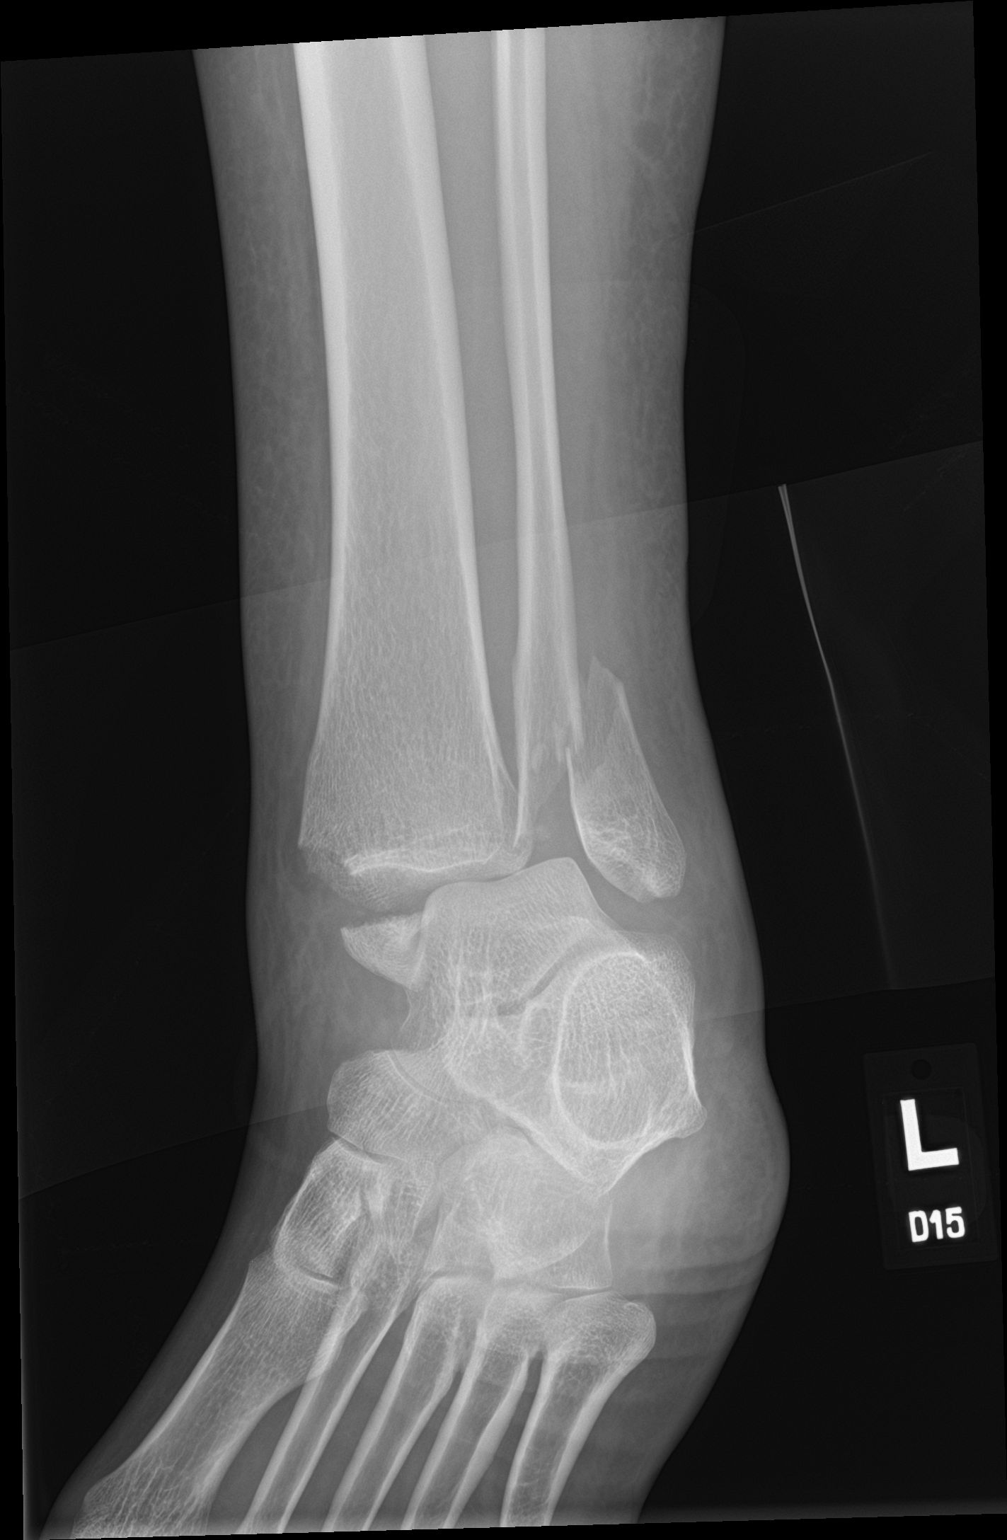

[ankle lat]
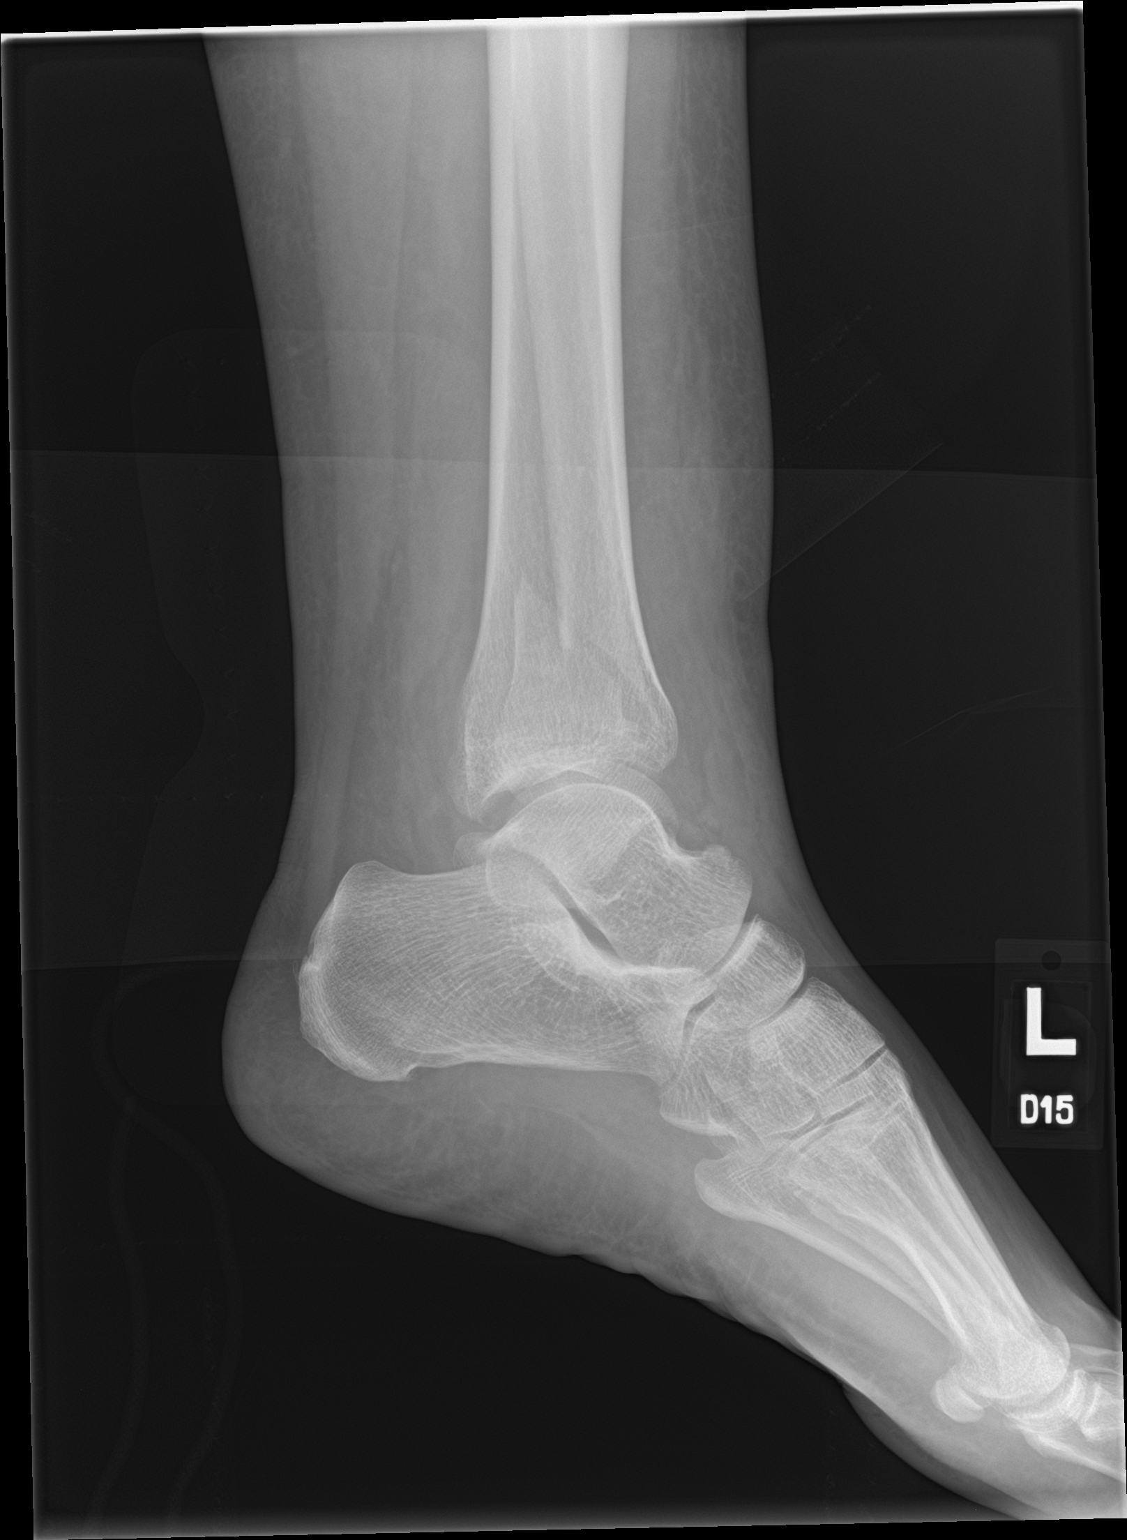

[3 of 3 positions shown; findings below may reference images not displayed]

FINDINGS: Acute transversely oriented fracture of the medial malleolus. Distal
fracture fragment remains aligned with the medial talus. Oblique
fracture through the distal fibular metaphysis. Lateral subluxation
of the talus relative to the tibia without frank dislocation. No
definite posterior malleolar fracture is identified. Subtalar
alignment remains anatomic. No acute fractures identified within the
foot. Diffuse soft tissue swelling about the ankle.
IMPRESSION: 1. Unstable bimalleolar fracture of the left ankle with lateral
subluxation of the talus relative to the tibial plafond.
2. No definite posterior malleolar fracture is seen.

## 2021-10-24 ENCOUNTER — Emergency Department (HOSPITAL_COMMUNITY)
Admission: EM | Admit: 2021-10-24 | Discharge: 2021-10-25 | Disposition: A | Discharge status: Other Acute Inpatient Hospital | Payer: Self-pay | Attending: Emergency Medicine | Admitting: Emergency Medicine

## 2021-10-24 ENCOUNTER — Other Ambulatory Visit: Payer: Self-pay

## 2021-10-24 ENCOUNTER — Encounter (HOSPITAL_COMMUNITY): Payer: Self-pay

## 2021-10-24 DIAGNOSIS — R45851 Suicidal ideations: Secondary | ICD-10-CM | POA: Insufficient documentation

## 2021-10-24 DIAGNOSIS — F1994 Other psychoactive substance use, unspecified with psychoactive substance-induced mood disorder: Secondary | ICD-10-CM

## 2021-10-24 DIAGNOSIS — F10129 Alcohol abuse with intoxication, unspecified: Secondary | ICD-10-CM | POA: Insufficient documentation

## 2021-10-24 DIAGNOSIS — E876 Hypokalemia: Secondary | ICD-10-CM | POA: Insufficient documentation

## 2021-10-24 DIAGNOSIS — F4323 Adjustment disorder with mixed anxiety and depressed mood: Secondary | ICD-10-CM | POA: Insufficient documentation

## 2021-10-24 DIAGNOSIS — F419 Anxiety disorder, unspecified: Secondary | ICD-10-CM | POA: Insufficient documentation

## 2021-10-24 DIAGNOSIS — F1914 Other psychoactive substance abuse with psychoactive substance-induced mood disorder: Secondary | ICD-10-CM | POA: Insufficient documentation

## 2021-10-24 DIAGNOSIS — Z1152 Encounter for screening for COVID-19: Secondary | ICD-10-CM | POA: Insufficient documentation

## 2021-10-24 DIAGNOSIS — D72819 Decreased white blood cell count, unspecified: Secondary | ICD-10-CM | POA: Insufficient documentation

## 2021-10-24 DIAGNOSIS — R45850 Homicidal ideations: Secondary | ICD-10-CM | POA: Insufficient documentation

## 2021-10-24 LAB — COMPREHENSIVE METABOLIC PANEL
ALT: 37 U/L (ref 0–44)
AST: 57 U/L — ABNORMAL HIGH (ref 15–41)
Albumin: 4.4 g/dL (ref 3.5–5.0)
Alkaline Phosphatase: 60 U/L (ref 38–126)
Anion gap: 11 (ref 5–15)
BUN: 11 mg/dL (ref 6–20)
CO2: 22 mmol/L (ref 22–32)
Calcium: 8.3 mg/dL — ABNORMAL LOW (ref 8.9–10.3)
Chloride: 105 mmol/L (ref 98–111)
Creatinine, Ser: 0.64 mg/dL (ref 0.44–1.00)
GFR, Estimated: >60 mL/min (ref >60)
Glucose, Bld: 82 mg/dL (ref 70–99)
Potassium: 3.1 mmol/L — ABNORMAL LOW (ref 3.5–5.1)
Sodium: 138 mmol/L (ref 135–145)
Total Bilirubin: 0.6 mg/dL (ref 0.3–1.2)
Total Protein: 7.6 g/dL (ref 6.5–8.1)

## 2021-10-24 LAB — RESP PANEL BY RT-PCR (FLU A&B, COVID) ARPGX2
Influenza A by PCR: NEGATIVE
Influenza B by PCR: NEGATIVE
SARS Coronavirus 2 by RT PCR: NEGATIVE

## 2021-10-24 LAB — CBC WITH DIFFERENTIAL/PLATELET
Abs Immature Granulocytes: 0 10*3/uL (ref 0.00–0.07)
Basophils Absolute: 0 10*3/uL (ref 0.0–0.1)
Basophils Relative: 1 %
Eosinophils Absolute: 0 10*3/uL (ref 0.0–0.5)
Eosinophils Relative: 0 %
HCT: 36.7 % (ref 36.0–46.0)
Hemoglobin: 12.5 g/dL (ref 12.0–15.0)
Immature Granulocytes: 0 %
Lymphocytes Relative: 46 %
Lymphs Abs: 1.7 10*3/uL (ref 0.7–4.0)
MCH: 35.1 pg — ABNORMAL HIGH (ref 26.0–34.0)
MCHC: 34.1 g/dL (ref 30.0–36.0)
MCV: 103.1 fL — ABNORMAL HIGH (ref 80.0–100.0)
Monocytes Absolute: 0.3 10*3/uL (ref 0.1–1.0)
Monocytes Relative: 7 %
Neutro Abs: 1.7 10*3/uL (ref 1.7–7.7)
Neutrophils Relative %: 46 %
Platelets: 211 10*3/uL (ref 150–400)
RBC: 3.56 MIL/uL — ABNORMAL LOW (ref 3.87–5.11)
RDW: 11.8 % (ref 11.5–15.5)
WBC: 3.8 10*3/uL — ABNORMAL LOW (ref 4.0–10.5)
nRBC: 0 % (ref 0.0–0.2)

## 2021-10-24 LAB — RAPID URINE DRUG SCREEN, HOSP PERFORMED
Amphetamines: NOT DETECTED
Barbiturates: NOT DETECTED
Benzodiazepines: NOT DETECTED
Cocaine: NOT DETECTED
Opiates: NOT DETECTED
Tetrahydrocannabinol: NOT DETECTED

## 2021-10-24 LAB — ACETAMINOPHEN LEVEL: Acetaminophen (Tylenol), Serum: <10 ug/mL — ABNORMAL LOW (ref 10–30)

## 2021-10-24 LAB — I-STAT BETA HCG BLOOD, ED (MC, WL, AP ONLY): I-stat hCG, quantitative: <5 m[IU]/mL (ref <5)

## 2021-10-24 LAB — SALICYLATE LEVEL: Salicylate Lvl: <7 mg/dL — ABNORMAL LOW (ref 7.0–30.0)

## 2021-10-24 LAB — ETHANOL: Alcohol, Ethyl (B): 297 mg/dL — ABNORMAL HIGH (ref <10)

## 2021-10-24 MED ORDER — POTASSIUM CHLORIDE CRYS ER 20 MEQ PO TBCR
40.0000 meq | EXTENDED_RELEASE_TABLET | Freq: Once | ORAL | Status: AC
  Administered 2021-10-24: 40 meq via ORAL
  Filled 2021-10-24: qty 2

## 2021-10-24 NOTE — ED Notes (Signed)
3 bags of belongings were placed behind the triage nurses station.

## 2021-10-24 NOTE — ED Triage Notes (Signed)
Per EMS- patient called 911 because she was feeling anxious. Patient states she had a therapist that died and was in the process of finding a new one.  Patient reports that she is SI and HI. Patient states she didn't want to kill people,but wanted them to suffer." Patient is intoxicated

## 2021-10-24 NOTE — BH Assessment (Signed)
Clinician messaged [name], RN: "Hey. It's Trey with TTS. Is the pt able to engage in the assessment, if so the pt will need to be placed in a private room. Is the pt under IVC? Also is the pt medically cleared?"    Clinician awaiting response.    Vertell Novak, [place], [facility], [place] Triage Specialist [phone]

## 2021-10-24 NOTE — ED Provider Notes (Signed)
[facility] DEPT Provider Note   CSN: [id number] Arrival date & time: 10/24/21  1427     History  Chief Complaint  Patient presents with   Suicidal   Homicidal   Alcohol Intoxication    Veronica Le is a 46 y.o. female with a history of anxiety presents to the ED due to suicidal and homicidal ideations.  Patient states she has been feeling extremely anxious over the past few days.  She is in the process of finding a therapist because her recent 1 died.  Patient admits to drinking 1 bottle of wine earlier today.  Denies drug use.  Very difficult to obtain HPI due to tangential speech.  History obtained from patient and past medical records. No interpreter used during encounter.       Home Medications Prior to Admission medications   Medication Sig Start Date End Date Taking? Authorizing Provider  celecoxib (CELEBREX) 200 MG capsule Take 1 capsule (200 mg total) by mouth 2 (two) times daily. 11/03/19   Meredith Pel, MD  [facility] ASPIRIN ADULT LOW DOSE 81 MG chewable tablet CHEW 1 TABLET (81 MG TOTAL) BY MOUTH 2 (TWO) TIMES DAILY. 11/28/19   Magnant, Charles L, PA-C  ibuprofen (ADVIL) 200 MG tablet Take 400 mg by mouth every 6 (six) hours as needed for moderate pain or headache.    [provider]  LORazepam (ATIVAN) 1 MG tablet Take 3 tablets PO on day one, take 2 tablets PO on day two, take 1 tablet PO on day one then STOP 06/16/20   Ripley Fraise, MD  Naphazoline-Glycerin (CLEAR EYES REDNESS RELIEF OP) Place 1 drop into both eyes 2 (two) times daily as needed (dr, irritated eyes).    [provider]      Allergies    Patient has no known allergies.    Review of Systems   Review of Systems  Constitutional:  Negative for chills and fever.  HENT:  Negative for rhinorrhea and sore throat.   Eyes:  Negative for visual disturbance.  Respiratory:  Negative for shortness of breath.   Cardiovascular:  Negative for chest pain and  palpitations.  Gastrointestinal:  Negative for abdominal pain.  Genitourinary:  Negative for dysuria.  Musculoskeletal:  Negative for myalgias.  Skin:  Negative for color change and rash.  Neurological:  Negative for dizziness and light-headedness.  Psychiatric/Behavioral:  Positive for suicidal ideas. Negative for hallucinations. The patient is nervous/anxious.     Physical Exam Updated Vital Signs BP 127/89 (BP Location: Left Arm)   Pulse 79   Temp (!) 97.4 F (36.3 C) (Oral)   Resp 18   Ht 5\' 5"  (1.651 m)   Wt 82.1 kg   LMP 10/24/2021   SpO2 100%   BMI 30.14 kg/m  Physical Exam Vitals and nursing note reviewed.  Constitutional:      General: She is not in acute distress.    Appearance: She is not ill-appearing.  HENT:     Head: Normocephalic.  Eyes:     Pupils: Pupils are equal, round, and reactive to light.  Cardiovascular:     Rate and Rhythm: Normal rate and regular rhythm.     Pulses: Normal pulses.     Heart sounds: Normal heart sounds. No murmur heard.    No friction rub. No gallop.  Pulmonary:     Effort: Pulmonary effort is normal.     Breath sounds: Normal breath sounds.  Abdominal:     General: Abdomen is  flat. There is no distension.     Palpations: Abdomen is soft.     Tenderness: There is no abdominal tenderness. There is no guarding or rebound.  Musculoskeletal:        General: Normal range of motion.     Cervical back: Neck supple.  Skin:    General: Skin is warm and dry.  Neurological:     General: No focal deficit present.     Mental Status: She is alert.  Psychiatric:        Mood and Affect: Mood normal. Affect is flat.        Speech: Speech is tangential.        Behavior: Behavior normal.        Thought Content: Thought content includes suicidal ideation.     ED Results / Procedures / Treatments   Labs (all labs ordered are listed, but only abnormal results are displayed) Labs Reviewed  COMPREHENSIVE METABOLIC PANEL - Abnormal;  Notable for the following components:      Result Value   Potassium 3.1 (*)    Calcium 8.3 (*)    AST 57 (*)    All other components within normal limits  ETHANOL - Abnormal; Notable for the following components:   Alcohol, Ethyl (B) 297 (*)    All other components within normal limits  CBC WITH DIFFERENTIAL/PLATELET - Abnormal; Notable for the following components:   WBC 3.8 (*)    RBC 3.56 (*)    MCV 103.1 (*)    MCH 35.1 (*)    All other components within normal limits  ACETAMINOPHEN LEVEL - Abnormal; Notable for the following components:   Acetaminophen (Tylenol), Serum <10 (*)    All other components within normal limits  SALICYLATE LEVEL - Abnormal; Notable for the following components:   Salicylate Lvl [id number] (*)    All other components within normal limits  RESP PANEL BY RT-PCR (FLU A&B, COVID) ARPGX2  RAPID URINE DRUG SCREEN, HOSP PERFORMED  I-STAT BETA HCG BLOOD, ED ([facility], [facility], [facility] ONLY)    EKG None  Radiology No results found.  Procedures Procedures    Medications Ordered in ED Medications  potassium chloride SA (KLOR-CON M) CR tablet 40 mEq (40 mEq Oral Given 10/24/21 1755)    ED Course/ Medical Decision Making/ A&P Clinical Course as of 10/24/21 1942  Fri Oct 24, 2021  1650 Alcohol, Ethyl (B)(!): 297 [CA]  1650 WBC(!): 3.8 [CA]  1650 Potassium(!): 3.1 [CA]  1650 AST(!): 57 [CA]    Clinical Course User Index [CA] Suzy Bouchard, PA-C                           Medical Decision Making Amount and/or Complexity of Data Reviewed Independent Historian: EMS External Data Reviewed: notes. Labs: ordered. Decision-making details documented in ED Course.  Risk Prescription drug management.   46 year old female presents to the ED due to suicidal and homicidal ideations.  Patient has a history of anxiety.  Currently searching for a new therapist.  Patient intoxicated during initial evaluation.  She admits to drinking 1 bottle of wine earlier today.  Very  difficult to obtain HPI due to tangential speech.  Upon arrival, stable vitals.  Patient in no acute distress.  Benign physical exam.  Medical clearance labs ordered.  CBC significant for leukopenia at 3.8.  Normal hemoglobin.  CMP significant for hypokalemia 3.1.  Potassium repleted here in the ED.  Normal renal function.  Ethanol elevated to 297.  Acetaminophen and salicylate levels within normal limits.  COVID/influenza negative.  Pregnancy test negative.  UDS negative.  6:37 PM reassessed patient at bedside.  Patient appears clinically sober.  Patient has been medically cleared for TTS evaluation.  The patient has been placed in psychiatric observation due to the need to provide a safe environment for the patient while obtaining psychiatric consultation and evaluation, as well as ongoing medical and medication management to treat the patient's condition.  The patient has not been placed under full IVC at this time.         Final Clinical Impression(s) / ED Diagnoses Final diagnoses:  Suicidal ideations    Rx / DC Orders ED Discharge Orders     None         Karie Kirks 10/24/21 1942    Pattricia Boss, MD 10/25/21 [phone]

## 2021-10-25 ENCOUNTER — Other Ambulatory Visit (HOSPITAL_COMMUNITY)
Admission: EM | Admit: 2021-10-25 | Discharge: 2021-10-30 | Disposition: A | Discharge status: Home / Self Care | Payer: No Payment, Other | Attending: Psychiatry | Admitting: Psychiatry

## 2021-10-25 DIAGNOSIS — Z5900 Homelessness unspecified: Secondary | ICD-10-CM | POA: Insufficient documentation

## 2021-10-25 DIAGNOSIS — F430 Acute stress reaction: Secondary | ICD-10-CM | POA: Diagnosis present

## 2021-10-25 DIAGNOSIS — F1994 Other psychoactive substance use, unspecified with psychoactive substance-induced mood disorder: Secondary | ICD-10-CM

## 2021-10-25 DIAGNOSIS — F4323 Adjustment disorder with mixed anxiety and depressed mood: Secondary | ICD-10-CM

## 2021-10-25 MED ORDER — THIAMINE HCL 100 MG/ML IJ SOLN: 100.0000 mg | Freq: Every day | INTRAMUSCULAR | Status: DC

## 2021-10-25 MED ORDER — LORAZEPAM 1 MG PO TABS: 1.0000 mg | ORAL_TABLET | ORAL | Status: DC | PRN

## 2021-10-25 MED ORDER — LORAZEPAM 2 MG/ML IJ SOLN: 1.0000 mg | INTRAMUSCULAR | Status: DC | PRN

## 2021-10-25 MED ORDER — ADULT MULTIVITAMIN W/MINERALS CH
1.0000 | ORAL_TABLET | Freq: Every day | ORAL | Status: DC
  Administered 2021-10-26 – 2021-10-30 (×5): 1 via ORAL
  Filled 2021-10-25 (×7): qty 1

## 2021-10-25 MED ORDER — FOLIC ACID 1 MG PO TABS
1.0000 mg | ORAL_TABLET | Freq: Every day | ORAL | Status: DC
  Administered 2021-10-25: 1 mg via ORAL
  Filled 2021-10-25: qty 1

## 2021-10-25 MED ORDER — LORAZEPAM 1 MG PO TABS
1.0000 mg | ORAL_TABLET | Freq: Every day | ORAL | Status: AC
  Administered 2021-10-29: 1 mg via ORAL
  Filled 2021-10-25: qty 1

## 2021-10-25 MED ORDER — LORAZEPAM 1 MG PO TABS
1.0000 mg | ORAL_TABLET | Freq: Three times a day (TID) | ORAL | Status: AC
  Administered 2021-10-27 (×3): 1 mg via ORAL
  Filled 2021-10-25 (×4): qty 1

## 2021-10-25 MED ORDER — FLUOXETINE HCL 10 MG PO CAPS
10.0000 mg | ORAL_CAPSULE | Freq: Once | ORAL | Status: AC
  Administered 2021-10-25: 10 mg via ORAL
  Filled 2021-10-25: qty 1

## 2021-10-25 MED ORDER — TRAZODONE HCL 100 MG PO TABS: 50.0000 mg | ORAL_TABLET | Freq: Every day | ORAL | Status: DC

## 2021-10-25 MED ORDER — TRAZODONE HCL 50 MG PO TABS
50.0000 mg | ORAL_TABLET | Freq: Every day | ORAL | Status: DC
  Administered 2021-10-25 – 2021-10-29 (×5): 50 mg via ORAL
  Filled 2021-10-25 (×5): qty 1

## 2021-10-25 MED ORDER — LORAZEPAM 1 MG PO TABS
1.0000 mg | ORAL_TABLET | Freq: Two times a day (BID) | ORAL | Status: AC
  Administered 2021-10-28 (×2): 1 mg via ORAL
  Filled 2021-10-25 (×2): qty 1

## 2021-10-25 MED ORDER — LORAZEPAM 1 MG PO TABS
1.0000 mg | ORAL_TABLET | Freq: Four times a day (QID) | ORAL | Status: AC
  Administered 2021-10-25 – 2021-10-26 (×6): 1 mg via ORAL
  Filled 2021-10-25 (×6): qty 1

## 2021-10-25 MED ORDER — LOPERAMIDE HCL 2 MG PO CAPS: 2.0000 mg | ORAL_CAPSULE | ORAL | Status: AC | PRN

## 2021-10-25 MED ORDER — THIAMINE MONONITRATE 100 MG PO TABS
100.0000 mg | ORAL_TABLET | Freq: Every day | ORAL | Status: DC
  Administered 2021-10-26 – 2021-10-30 (×5): 100 mg via ORAL
  Filled 2021-10-25 (×5): qty 1

## 2021-10-25 MED ORDER — THIAMINE MONONITRATE 100 MG PO TABS
100.0000 mg | ORAL_TABLET | Freq: Every day | ORAL | Status: DC
  Administered 2021-10-25: 100 mg via ORAL
  Filled 2021-10-25: qty 1

## 2021-10-25 MED ORDER — MAGNESIUM HYDROXIDE 400 MG/5ML PO SUSP: 30.0000 mL | Freq: Every day | ORAL | Status: DC | PRN

## 2021-10-25 MED ORDER — HYDROXYZINE HCL 25 MG PO TABS: 25.0000 mg | ORAL_TABLET | Freq: Four times a day (QID) | ORAL | Status: AC | PRN

## 2021-10-25 MED ORDER — FLUOXETINE HCL 10 MG PO CAPS: 10.0000 mg | ORAL_CAPSULE | Freq: Every day | ORAL | Status: DC

## 2021-10-25 MED ORDER — THIAMINE HCL 100 MG/ML IJ SOLN
100.0000 mg | Freq: Once | INTRAMUSCULAR | Status: AC
  Filled 2021-10-25: qty 2

## 2021-10-25 MED ORDER — LORAZEPAM 1 MG PO TABS: 1.0000 mg | ORAL_TABLET | Freq: Four times a day (QID) | ORAL | Status: AC | PRN

## 2021-10-25 MED ORDER — ACETAMINOPHEN 325 MG PO TABS
650.0000 mg | ORAL_TABLET | Freq: Four times a day (QID) | ORAL | Status: DC | PRN
  Administered 2021-10-25: 650 mg via ORAL
  Filled 2021-10-25: qty 2

## 2021-10-25 MED ORDER — ACETAMINOPHEN 325 MG PO TABS: 650.0000 mg | ORAL_TABLET | Freq: Four times a day (QID) | ORAL | Status: DC | PRN

## 2021-10-25 MED ORDER — ALUM & MAG HYDROXIDE-SIMETH 200-200-20 MG/5ML PO SUSP: 30.0000 mL | ORAL | Status: DC | PRN

## 2021-10-25 MED ORDER — ADULT MULTIVITAMIN W/MINERALS CH
1.0000 | ORAL_TABLET | Freq: Every day | ORAL | Status: DC
  Administered 2021-10-25: 1 via ORAL
  Filled 2021-10-25: qty 1

## 2021-10-25 MED ORDER — ONDANSETRON 4 MG PO TBDP: 4.0000 mg | ORAL_TABLET | Freq: Four times a day (QID) | ORAL | Status: AC | PRN

## 2021-10-25 NOTE — ED Notes (Signed)
Patient was admitted to OBS. Patient denies SI/HI and AVH. Patient was oriented to the unit and did not want to eat right away. Patient reported she would wait until dinner time. Patient has been calm and cooperative while on the unit.

## 2021-10-25 NOTE — ED Provider Notes (Signed)
[facility] Continuous Assessment Admission H&P  Date: 10/25/21 Patient Name: Veronica Le MRN: [id number] Chief Complaint: No chief complaint on file.     Diagnoses:  Final diagnoses:  Substance induced mood disorder (HCC)  Adjustment disorder with mixed anxiety and depressed mood    HPI: [name] is a 46 year old African-American female who presents after recent transfer from [facility] emergency department due to substance abuse issues and homelessness.  She denies suicidal or homicidal ideations.  Denies auditory visual hallucinations.  Reports struggling with alcohol use.  Reporting alcohol cravings during this assessment.  Denied any withdrawal symptoms i.e. headaches nausea vomiting or tremors. patient recounted information provided to S. Jerelene Redden NP.   Per practitioner's assessment note." On evaluation [name] reports  she is feeling scared today, states she's been worked up anticipating talking with mental health today and also trying to determine her next steps.  Pt reports she feels alone, she feels like she does not have support, "I feel like I am out there in the world with no one else."   She appears sad, endorses symptoms of worthlessness and despair. She is alert and oriented x4; clear and coherent and able to verbalize her needs but is labile and anxious.  She shares life examples in which she felt she was unappreciated and devalued which has triggered her today. "    [name] is sitting in no acute distress. She is alert/oriented x 4; calm/cooperative; and mood congruent with affect. She is speaking in a clear tone at moderate volume, and normal pace; with good eye contact.  Her thought process is coherent and relevant; There is no indication that she is currently responding to internal/external stimuli or experiencing delusional thought content; and she has denied suicidal/self-harm/homicidal ideation, psychosis, and paranoia.   Patient has remained calm  throughout assessment and has answered questions appropriately.      Total Time spent with patient: 15 minutes  Musculoskeletal  Strength & Muscle Tone: within normal limits Gait & Station: normal Patient leans: N/A  Psychiatric Specialty Exam  Presentation General Appearance: No data recorded Eye Contact:No data recorded Speech:No data recorded Speech Volume:No data recorded Handedness:No data recorded  Mood and Affect  Mood: Anxious; Worthless; Labile  Affect: Congruent; Depressed; Labile   Thought Process  Thought Processes: Coherent; Goal Directed  Descriptions of Associations:Circumstantial  Orientation:Full (Time, Place and Person)  Thought Content:Rumination  Diagnosis of Schizophrenia or Schizoaffective disorder in past: No   Hallucinations:Hallucinations: None  Ideas of Reference:None  Suicidal Thoughts:No data recorded Homicidal Thoughts:Homicidal Thoughts: No   Sensorium  Memory: Immediate Good; Recent Good  Judgment: -- (impuslive)  Insight: Fair   Community education officer  Concentration: Fair  Attention Span: Fair  Recall: Good  Fund of Knowledge: Good  Language: Good   Psychomotor Activity  Psychomotor Activity: Psychomotor Activity: Normal   Assets  Assets: Communication Skills; Desire for Improvement; Resilience   Sleep  Sleep: Number of Hours of Sleep: 5   No data recorded  Physical Exam ROS  Blood pressure (!) 151/89, pulse 84, temperature 99 F (37.2 C), temperature source Oral, resp. rate 18, last menstrual period 10/24/2021, SpO2 100 %, unknown if currently breastfeeding. There is no height or weight on file to calculate BMI.  Past Psychiatric History:    Is the patient at risk to self? Yes  Has the patient been a risk to self in the past 6 months? No .    Has the patient been a risk to self within  the distant past? No   Is the patient a risk to others? No   Has the patient been a risk to others in  the past 6 months? No   Has the patient been a risk to others within the distant past? No   Past Medical History:  Past Medical History:  Diagnosis Date   Medical history non-contributory    Panic attack     Past Surgical History:  Procedure Laterality Date   NO PAST SURGERIES     ORIF ANKLE FRACTURE Left 11/01/2019   Procedure: OPEN REDUCTION INTERNAL FIXATION (ORIF) LEFT ANKLE FRACTURE;  Surgeon: Meredith Pel, MD;  Location: [facility];  Service: Orthopedics;  Laterality: Left;    Family History: No family history on file.  Social History:  Social History   Socioeconomic History   Marital status: Divorced    Spouse name: Not on file   Number of children: Not on file   Years of education: Not on file   Highest education level: Not on file  Occupational History   Not on file  Tobacco Use   Smoking status: Some Days    Types: Cigarettes   Smokeless tobacco: Never  Vaping Use   Vaping Use: Never used  Substance and Sexual Activity   Alcohol use: Yes   Drug use: Yes    Types: Marijuana   Sexual activity: Yes    Birth control/protection: None  Other Topics Concern   Not on file  Social History Narrative   Not on file   Social Determinants of Health   Financial Resource Strain: Not on file  Food Insecurity: Not on file  Transportation Needs: Not on file  Physical Activity: Not on file  Stress: Not on file  Social Connections: Not on file  Intimate Partner Violence: Not on file    SDOH:  SDOH Screenings   Tobacco Use: High Risk (10/24/2021)    Last Labs:  Admission on 10/24/2021, Discharged on 10/25/2021  Component Date Value Ref Range Status   SARS Coronavirus 2 by RT PCR 10/24/2021 NEGATIVE  NEGATIVE Final   Comment: (NOTE) SARS-CoV-2 target nucleic acids are NOT DETECTED.  The SARS-CoV-2 RNA is generally detectable in upper respiratory specimens during the acute phase of infection. The lowest concentration of SARS-CoV-2 viral copies this assay  can detect is 138 copies/mL. A negative result does not preclude SARS-Cov-2 infection and should not be used as the sole basis for treatment or other patient management decisions. A negative result may occur with  improper specimen collection/handling, submission of specimen other than nasopharyngeal swab, presence of viral mutation(s) within the areas targeted by this assay, and inadequate number of viral copies(<138 copies/mL). A negative result must be combined with clinical observations, patient history, and epidemiological information. The expected result is Negative.  Fact Sheet for Patients:  EntrepreneurPulse.com.au  Fact Sheet for Healthcare Providers:  IncredibleEmployment.be  This test is no                          t yet approved or cleared by the [place] FDA and  has been authorized for detection and/or diagnosis of SARS-CoV-2 by FDA under an Emergency Use Authorization (EUA). This EUA will remain  in effect (meaning this test can be used) for the duration of the COVID-19 declaration under Section 564(b)(1) of the Act, 21 U.S.C.section 360bbb-3(b)(1), unless the authorization is terminated  or revoked sooner.       Influenza A by  PCR 10/24/2021 NEGATIVE  NEGATIVE Final   Influenza B by PCR 10/24/2021 NEGATIVE  NEGATIVE Final   Comment: (NOTE) The Xpert Xpress SARS-CoV-2/FLU/RSV plus assay is intended as an aid in the diagnosis of influenza from Nasopharyngeal swab specimens and should not be used as a sole basis for treatment. Nasal washings and aspirates are unacceptable for Xpert Xpress SARS-CoV-2/FLU/RSV testing.  Fact Sheet for Patients: EntrepreneurPulse.com.au  Fact Sheet for Healthcare Providers: IncredibleEmployment.be  This test is not yet approved or cleared by the [place] FDA and has been authorized for detection and/or diagnosis of SARS-CoV-2 by FDA under an  Emergency Use Authorization (EUA). This EUA will remain in effect (meaning this test can be used) for the duration of the COVID-19 declaration under Section 564(b)(1) of the Act, 21 U.S.C. section 360bbb-3(b)(1), unless the authorization is terminated or revoked.  Performed at [facility], [place] [address], [place], [place] [address]    Sodium 10/24/2021 138  135 - 145 mmol/L Final   Potassium 10/24/2021 3.1 (L)  3.5 - 5.1 mmol/L Final   Chloride 10/24/2021 105  98 - 111 mmol/L Final   CO2 10/24/2021 22  22 - 32 mmol/L Final   Glucose, Bld 10/24/2021 82  70 - 99 mg/dL Final   Glucose reference range applies only to samples taken after fasting for at least 8 hours.   BUN 10/24/2021 11  6 - 20 mg/dL Final   Creatinine, Ser 10/24/2021 0.64  0.44 - 1.00 mg/dL Final   Calcium 10/24/2021 8.3 (L)  8.9 - 10.3 mg/dL Final   Total Protein 10/24/2021 7.6  6.5 - 8.1 g/dL Final   Albumin 10/24/2021 4.4  3.5 - 5.0 g/dL Final   AST 10/24/2021 57 (H)  15 - 41 U/L Final   ALT 10/24/2021 37  0 - 44 U/L Final   Alkaline Phosphatase 10/24/2021 60  38 - 126 U/L Final   Total Bilirubin 10/24/2021 0.6  0.3 - 1.2 mg/dL Final   GFR, Estimated 10/24/2021 >60  >60 mL/min Final   Comment: (NOTE) Calculated using the CKD-EPI Creatinine Equation (2021)    Anion gap 10/24/2021 11  5 - 15 Final   Performed at [facility], [place] [address] [place], [place] [address]   Alcohol, Ethyl (B) 10/24/2021 297 (H)  <10 mg/dL Final   Comment: (NOTE) Lowest detectable limit for serum alcohol is 10 mg/dL.  For medical purposes only. Performed at [facility], [place] [address] [place],  [address]    Opiates 10/24/2021 NONE DETECTED  NONE DETECTED Final   Cocaine 10/24/2021 NONE DETECTED  NONE DETECTED Final   Benzodiazepines 10/24/2021 NONE DETECTED  NONE DETECTED Final   Amphetamines 10/24/2021 NONE DETECTED  NONE DETECTED Final   Tetrahydrocannabinol  10/24/2021 NONE DETECTED  NONE DETECTED Final   Barbiturates 10/24/2021 NONE DETECTED  NONE DETECTED Final   Comment: (NOTE) DRUG SCREEN FOR MEDICAL PURPOSES ONLY.  IF CONFIRMATION IS NEEDED FOR ANY PURPOSE, NOTIFY LAB WITHIN 5 DAYS.  LOWEST DETECTABLE LIMITS FOR URINE DRUG SCREEN Drug Class                     Cutoff (ng/mL) Amphetamine and metabolites    1000 Barbiturate and metabolites    200 Benzodiazepine                 200 Opiates and metabolites        300 Cocaine and metabolites  300 THC                            50 Performed at [facility], [place] [address] [place], [place] [address]    WBC 10/24/2021 3.8 (L)  4.0 - 10.5 K/uL Final   RBC 10/24/2021 3.56 (L)  3.87 - 5.11 MIL/uL Final   Hemoglobin 10/24/2021 12.5  12.0 - 15.0 g/dL Final   HCT 10/24/2021 36.7  36.0 - 46.0 % Final   MCV 10/24/2021 103.1 (H)  80.0 - 100.0 fL Final   MCH 10/24/2021 35.1 (H)  26.0 - 34.0 pg Final   MCHC 10/24/2021 34.1  30.0 - 36.0 g/dL Final   RDW 10/24/2021 11.8  11.5 - 15.5 % Final   Platelets 10/24/2021 211  150 - 400 K/uL Final   nRBC 10/24/2021 0.0  0.0 - 0.2 % Final   Neutrophils Relative % 10/24/2021 46  % Final   Neutro Abs 10/24/2021 1.7  1.7 - 7.7 K/uL Final   Lymphocytes Relative 10/24/2021 46  % Final   Lymphs Abs 10/24/2021 1.7  0.7 - 4.0 K/uL Final   Monocytes Relative 10/24/2021 7  % Final   Monocytes Absolute 10/24/2021 0.3  0.1 - 1.0 K/uL Final   Eosinophils Relative 10/24/2021 0  % Final   Eosinophils Absolute 10/24/2021 0.0  0.0 - 0.5 K/uL Final   Basophils Relative 10/24/2021 1  % Final   Basophils Absolute 10/24/2021 0.0  0.0 - 0.1 K/uL Final   Immature Granulocytes 10/24/2021 0  % Final   Abs Immature Granulocytes 10/24/2021 0.00  0.00 - 0.07 K/uL Final   Performed at [facility], [place] [address], [place], [place] [address]   I-stat hCG, quantitative 10/24/2021 <5.0  <5 mIU/mL Final   Comment 3 10/24/2021          Final    Comment:   GEST. AGE      CONC.  (mIU/mL)   <=1 WEEK        5 - 50     2 WEEKS       50 - 500     3 WEEKS       100 - 10,000     4 WEEKS     1,000 - 30,000        FEMALE AND NON-PREGNANT FEMALE:     LESS THAN 5 mIU/mL    Acetaminophen (Tylenol), Serum 10/24/2021 <10 (L)  10 - 30 ug/mL Final   Comment: (NOTE) Therapeutic concentrations vary significantly. A range of 10-30 ug/mL  may be an effective concentration for many patients. However, some  are best treated at concentrations outside of this range. Acetaminophen concentrations >150 ug/mL at 4 hours after ingestion  and >50 ug/mL at 12 hours after ingestion are often associated with  toxic reactions.  Performed at [facility], [place] [address], [place], [place] [id number]    Salicylate Lvl [id number] <7.0 (L)  7.0 - 30.0 mg/dL Final   Performed at [place] [address], [place], [place] [address]    Allergies: Patient has no known allergies.  PTA Medications: (Not in a hospital admission)   Medical Decision Making  Recommended for facility based crisis when bed becomes available -Initiated on Ativan protocol continue to monitor CIWA -Patient to start fluoxetine 10 mg p.o. daily for mood stabilization     Recommendations  Based on my evaluation the patient does  not appear to have an emergency medical condition.  Derrill Center, NP 10/25/21  4:14 PM

## 2021-10-25 NOTE — Consult Note (Signed)
Telepsych Consultation   Reason for Consult:  Telepsych Assessment Referring Physician:  Claudette Stapleraroline Aberman, PA-C [facility] Location [facility] Provider: Other: virtual home office  Patient Identification: Veronica Le MRN:  [id number] Principal Diagnosis: Substance induced mood disorder (HCC) Diagnosis:  Principal Problem:   Substance induced mood disorder (HCC) Active Problems:   Adjustment disorder with mixed anxiety and depressed mood   Total Time spent with patient: 30 minutes  Subjective:   Veronica Le is a 46 y.o. female patient admitted with suicidal ideations, homicidal ideations and alcohol intoxication.  UDS on arrival was 297.   HPI:  Patient seen via telepsych by this provider; chart reviewed and consulted with Dr. Lucianne MussKumar on 10/25/21.  On evaluation [name] reports  she is feeling scared today, states she's been worked up anticipating talking with mental health today and also trying to determine her next steps.  Pt reports she feels alone, she feels like she does not have support, "I feel like I am out there in the world with no one else."   She appears sad, endorses symptoms of worthlessness and despair. She is alert and oriented x4; clear and coherent and able to verbalize her needs but is labile and anxious.  She shares life examples in which she felt she was unappreciated and devalued which has triggered her today.    She denies suicidal thoughts or homicidal thoughts but ruminates over "the world being a confusing place." Pt reports about 2 weeks ago she got out of "a toxic relationship" with her boyfriend of 4 years. Since she was living in his home she had to leave when they amidst the breakup. Since then she has been staying at various clients homes but this was temporary housing for her.  At present she is homeless and requests assistance with alcohol abuse/dependence.  She reports drinking alcohol daily, varying amounts.  Has tremors when she does  not drink but denies hx of complicated withdrawals or seizures.    She reports sleep as "okay" gets about 6 hours of broken sleep appetite it good. She is divorced, has 3 children whom she does not have contact with, states, "they always think I am trying to play the victim."    She reports a long standing history of anxiety that started during childhood, describes symptoms suggestive of OCD but no dx.  She reports she self medicates, treats her "anxiety and depression" with alcohol because she 's been afraid to take medications. 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  "I don't want to be a zombie."  Pt denies medical concerns;     Per ED Provider Admission Assessment 10/24/2021@1427  Chief Complaint  Patient presents with   Suicidal   Homicidal   Alcohol Intoxication      Veronica Le is a 46 y.o. female with a history of anxiety presents to the ED due to suicidal and homicidal ideations.  Patient states she has been feeling extremely anxious over the past few days.  She is in the process of finding a therapist because her recent 1 died.  Patient admits to drinking 1 bottle of wine earlier today.  Denies drug use.  Very difficult to obtain HPI due to tangential speech.   History obtained from patient and past medical records. No interpreter used during encounter.   Past Psychiatric History: Depression, Anxiety, Alcoholism  Risk to Self:  no Risk to Others:  no Prior Inpatient Therapy:no  Prior Outpatient Therapy: no   Past Medical History:  Past Medical History:  Diagnosis Date   Medical history non-contributory    Panic attack     Past Surgical History:  Procedure Laterality Date   NO PAST SURGERIES     ORIF ANKLE FRACTURE Left 11/01/2019   Procedure: OPEN REDUCTION INTERNAL FIXATION (ORIF) LEFT ANKLE FRACTURE;  Surgeon: Cammy Copa, MD;  Location: [facility] OR;  Service: Orthopedics;  Laterality: Left;    Family History: History reviewed. No pertinent family history. Family Psychiatric  History: unknown Social History:  Social History   Substance and Sexual Activity  Alcohol Use Yes     Social History   Substance and Sexual Activity  Drug Use Yes   Types: Marijuana    Social History   Socioeconomic History   Marital status: Divorced    Spouse name: Not on file   Number of children: Not on file   Years of education: Not on file   Highest education level: Not on file  Occupational History   Not on file  Tobacco Use   Smoking status: Some Days    Types: Cigarettes   Smokeless tobacco: Never  Vaping Use   Vaping Use: Never used  Substance and Sexual Activity   Alcohol use: Yes   Drug use: Yes    Types: Marijuana   Sexual activity: Yes    Birth control/protection: None  Other Topics Concern   Not on file  Social History Narrative   Not on file   Social Determinants of Health   Financial Resource Strain: Not on file  Food Insecurity: Not on file  Transportation Needs: Not on file  Physical Activity: Not on file  Stress: Not on file  Social Connections: Not on file   Additional Social History:    Allergies:  No Known Allergies  Labs:  Results for orders placed or performed during the hospital encounter of 10/24/21 (from the past 48 hour(s))  Comprehensive metabolic panel     Status: Abnormal   Collection Time: 10/24/21  3:47 PM  Result Value Ref Range   Sodium 138 135 - 145 mmol/L   Potassium 3.1 (L) 3.5 - 5.1 mmol/L   Chloride 105 98 - 111 mmol/L   CO2 22 22 - 32 mmol/L   Glucose, Bld 82 70 - 99 mg/dL    Comment: Glucose reference range applies only to samples taken after fasting for at least 8 hours.   BUN 11 6 - 20 mg/dL   Creatinine, Ser 5.46 0.44 - 1.00 mg/dL   Calcium 8.3 (L) 8.9 - 10.3 mg/dL   Total Protein 7.6 6.5 - 8.1 g/dL   Albumin 4.4 3.5 - 5.0 g/dL   AST 57 (H) 15 - 41 U/L   ALT 37 0 - 44 U/L   Alkaline Phosphatase 60 38 - 126 U/L    Total Bilirubin 0.6 0.3 - 1.2 mg/dL   GFR, Estimated >27 >03 mL/min    Comment: (NOTE) Calculated using the CKD-EPI Creatinine Equation (2021)    Anion gap 11 5 - 15    Comment: Performed at [facility], [address] [place], [place] [address]  Ethanol     Status: Abnormal   Collection Time: 10/24/21  3:47 PM  Result Value Ref Range   Alcohol, Ethyl (B) 297 (H) <10 mg/dL    Comment: (NOTE) Lowest detectable limit for serum alcohol is 10 mg/dL.  For medical purposes only. Performed at [facility], [address] [place], [place] [address]   CBC with Diff     Status: Abnormal   Collection Time: 10/24/21  3:47 PM  Result Value Ref Range   WBC 3.8 (L) 4.0 - 10.5 K/uL   RBC 3.56 (L) 3.87 - 5.11 MIL/uL   Hemoglobin 12.5 12.0 - 15.0 g/dL   HCT 60.4 54.0 - 98.1 %   MCV 103.1 (H) 80.0 - 100.0 fL   MCH 35.1 (H) 26.0 - 34.0 pg   MCHC 34.1 30.0 - 36.0 g/dL   RDW 19.1 47.8 - 29.5 %   Platelets 211 150 - 400 K/uL   nRBC 0.0 0.0 - 0.2 %   Neutrophils Relative % 46 %   Neutro Abs 1.7 1.7 - 7.7 K/uL   Lymphocytes Relative 46 %   Lymphs Abs 1.7 0.7 - 4.0 K/uL   Monocytes Relative 7 %   Monocytes Absolute 0.3 0.1 - 1.0 K/uL   Eosinophils Relative 0 %   Eosinophils Absolute 0.0 0.0 - 0.5 K/uL   Basophils Relative 1 %   Basophils Absolute 0.0 0.0 - 0.1 K/uL   Immature Granulocytes 0 %   Abs Immature Granulocytes 0.00 0.00 - 0.07 K/uL    Comment: Performed at [facility], [address] [place], [place] [address]  Acetaminophen level     Status: Abnormal   Collection Time: 10/24/21  3:47 PM  Result Value Ref Range   Acetaminophen (Tylenol), Serum <10 (L) 10 - 30 ug/mL    Comment: (NOTE) Therapeutic concentrations vary significantly. A range of 10-30 ug/mL  may be an effective concentration for many patients. However, some  are best treated at concentrations outside of this range. Acetaminophen concentrations >150 ug/mL at  4 hours after ingestion  and >50 ug/mL at 12 hours after ingestion are often associated with  toxic reactions.  Performed at [facility], [address], [place], [place] [address]   Salicylate level     Status: Abnormal   Collection Time: 10/24/21  3:47 PM  Result Value Ref Range   Salicylate Lvl <7.0 (L) 7.0 - 30.0 mg/dL    Comment: Performed at [facility], [address] [place], [place] [address]  I-Stat beta hCG blood, ED     Status: None   Collection Time: 10/24/21  3:56 PM  Result Value Ref Range   I-stat hCG, quantitative <5.0 <5 mIU/mL   Comment 3            Comment:   GEST. AGE      CONC.  (mIU/mL)   <=1 WEEK        5 - 50     2 WEEKS       50 - 500     3 WEEKS       100 - 10,000     4 WEEKS     1,000 - 30,000        FEMALE AND NON-PREGNANT FEMALE:     LESS THAN 5 mIU/mL   Resp Panel by RT-PCR (Flu A&B, Covid)     Status: None   Collection Time: 10/24/21  4:15 PM   Specimen: Nasal Swab  Result Value Ref Range   SARS Coronavirus 2 by RT PCR NEGATIVE NEGATIVE    Comment: (NOTE) SARS-CoV-2 target nucleic acids are NOT DETECTED.  The SARS-CoV-2 RNA is generally detectable in upper respiratory specimens during the acute phase of infection. The lowest concentration of SARS-CoV-2 viral copies this assay can  detect is 138 copies/mL. A negative result does not preclude SARS-Cov-2 infection and should not be used as the sole basis for treatment or other patient management decisions. A negative result may occur with  improper specimen collection/handling, submission of specimen other than nasopharyngeal swab, presence of viral mutation(s) within the areas targeted by this assay, and inadequate number of viral copies(<138 copies/mL). A negative result must be combined with clinical observations, patient history, and epidemiological information. The expected result is Negative.  Fact Sheet for Patients:   EntrepreneurPulse.com.au  Fact Sheet for Healthcare Providers:  IncredibleEmployment.be  This test is no t yet approved or cleared by the [place] FDA and  has been authorized for detection and/or diagnosis of SARS-CoV-2 by FDA under an Emergency Use Authorization (EUA). This EUA will remain  in effect (meaning this test can be used) for the duration of the COVID-19 declaration under Section 564(b)(1) of the Act, 21 U.S.C.section 360bbb-3(b)(1), unless the authorization is terminated  or revoked sooner.       Influenza A by PCR NEGATIVE NEGATIVE   Influenza B by PCR NEGATIVE NEGATIVE    Comment: (NOTE) The Xpert Xpress SARS-CoV-2/FLU/RSV plus assay is intended as an aid in the diagnosis of influenza from Nasopharyngeal swab specimens and should not be used as a sole basis for treatment. Nasal washings and aspirates are unacceptable for Xpert Xpress SARS-CoV-2/FLU/RSV testing.  Fact Sheet for Patients: EntrepreneurPulse.com.au  Fact Sheet for Healthcare Providers: IncredibleEmployment.be  This test is not yet approved or cleared by the [place] FDA and has been authorized for detection and/or diagnosis of SARS-CoV-2 by FDA under an Emergency Use Authorization (EUA). This EUA will remain in effect (meaning this test can be used) for the duration of the COVID-19 declaration under Section 564(b)(1) of the Act, 21 U.S.C. section 360bbb-3(b)(1), unless the authorization is terminated or revoked.  Performed at [facility], [place] [address], [place], [place] [address]   Urine rapid drug screen (hosp performed)     Status: None   Collection Time: 10/24/21  4:15 PM  Result Value Ref Range   Opiates NONE DETECTED NONE DETECTED   Cocaine NONE DETECTED NONE DETECTED   Benzodiazepines NONE DETECTED NONE DETECTED   Amphetamines NONE DETECTED NONE DETECTED   Tetrahydrocannabinol NONE  DETECTED NONE DETECTED   Barbiturates NONE DETECTED NONE DETECTED    Comment: (NOTE) DRUG SCREEN FOR MEDICAL PURPOSES ONLY.  IF CONFIRMATION IS NEEDED FOR ANY PURPOSE, NOTIFY LAB WITHIN 5 DAYS.  LOWEST DETECTABLE LIMITS FOR URINE DRUG SCREEN Drug Class                     Cutoff (ng/mL) Amphetamine and metabolites    1000 Barbiturate and metabolites    200 Benzodiazepine                 200 Opiates and metabolites        300 Cocaine and metabolites        300 THC                            50 Performed at [facility], [place] [address] [place], [place] [address]     Medications:  Current Facility-Administered Medications  Medication Dose Route Frequency Provider Last Rate Last Admin   acetaminophen (TYLENOL) tablet 650 mg  650 mg Oral X7D PRN Delora Fuel, MD   532 mg at 10/25/21 0418   FLUoxetine (PROZAC)  capsule 10 mg  10 mg Oral QHS Ophelia Shoulder E, NP       folic acid (FOLVITE) tablet 1 mg  1 mg Oral Daily Ophelia Shoulder E, NP       LORazepam (ATIVAN) tablet 1-4 mg  1-4 mg Oral Q1H PRN Chales Abrahams, NP       Or   LORazepam (ATIVAN) injection 1-4 mg  1-4 mg Intravenous Q1H PRN Chales Abrahams, NP       multivitamin with minerals tablet 1 tablet  1 tablet Oral Daily Ophelia Shoulder E, NP       thiamine (VITAMIN B1) tablet 100 mg  100 mg Oral Daily Ophelia Shoulder E, NP       Or   thiamine (VITAMIN B1) injection 100 mg  100 mg Intravenous Daily Chales Abrahams, NP       Current Outpatient Medications  Medication Sig Dispense Refill   celecoxib (CELEBREX) 200 MG capsule Take 1 capsule (200 mg total) by mouth 2 (two) times daily. (Patient not taking: Reported on 10/24/2021) 28 capsule 0   [employer] ASPIRIN ADULT LOW DOSE 81 MG chewable tablet CHEW 1 TABLET (81 MG TOTAL) BY MOUTH 2 (TWO) TIMES DAILY. (Patient not taking: Reported on 10/24/2021) 60 tablet 0   LORazepam (ATIVAN) 1 MG tablet Take 3 tablets PO on day one, take 2 tablets PO on day two, take 1 tablet PO on  day one then STOP (Patient not taking: Reported on 10/24/2021) 6 tablet 0    Musculoskeletal:pt moves all extremities and ambulates independently.  Strength & Muscle Tone: within normal limits Gait & Station: normal Patient leans: N/A    Psychiatric Specialty Exam:  Presentation  General Appearance: No data recorded Eye Contact:No data recorded Speech:No data recorded Speech Volume:No data recorded Handedness:No data recorded  Mood and Affect  Mood:Anxious; Worthless; Labile  Affect:Congruent; Depressed; Labile   Thought Process  Thought Processes:Coherent; Goal Directed  Descriptions of Associations:Circumstantial  Orientation:Full (Time, Place and Person)  Thought Content:Rumination  History of Schizophrenia/Schizoaffective disorder:No  Duration of Psychotic Symptoms:No data recorded Hallucinations:Hallucinations: None  Ideas of Reference:None  Suicidal Thoughts:No data recorded Homicidal Thoughts:Homicidal Thoughts: No   Sensorium  Memory:Immediate Good; Recent Good  Judgment:-- (impuslive)  Insight:Fair   Executive Functions  Concentration:Fair  Attention Span:Fair  Recall:Good  Fund of Knowledge:Good  Language:Good   Psychomotor Activity  Psychomotor Activity:Psychomotor Activity: Normal   Assets  Assets:Communication Skills; Desire for Improvement; Resilience   Sleep  Sleep:Number of Hours of Sleep: 5    Physical Exam: Physical Exam Constitutional:      Appearance: Normal appearance.  Cardiovascular:     Rate and Rhythm: Normal rate.     Pulses: Normal pulses.  Pulmonary:     Effort: Pulmonary effort is normal.  Musculoskeletal:        General: Normal range of motion.     Cervical back: Normal range of motion.  Neurological:     Mental Status: She is alert and oriented to person, place, and time. Mental status is at baseline.  Psychiatric:        Attention and Perception: Attention and perception normal.        Mood  and Affect: Mood is anxious and depressed. Affect is labile and tearful.        Speech: Speech normal.        Behavior: Behavior is cooperative.        Thought Content: Thought content normal.  Cognition and Memory: Cognition and memory normal.        Judgment: Judgment is impulsive.    Review of Systems  Constitutional: Negative.   HENT: Negative.    Eyes: Negative.   Respiratory: Negative.    Cardiovascular: Negative.   Gastrointestinal: Negative.   Genitourinary: Negative.   Musculoskeletal: Negative.   Skin: Negative.   Neurological:  Positive for tremors. Negative for focal weakness, seizures and weakness.  Endo/Heme/Allergies: Negative.   Psychiatric/Behavioral:  Positive for depression and substance abuse. The patient is nervous/anxious and has insomnia.    Blood pressure (!) 140/87, pulse 78, temperature 98.3 F (36.8 C), temperature source Oral, resp. rate 16, height 5\' 5"  (1.651 m), weight 82.1 kg, last menstrual period 10/24/2021, SpO2 100 %, unknown if currently breastfeeding. Body mass index is 30.14 kg/m.  Treatment Plan Summary: Pt presents with sadness, hopelessness and feelings of despair; and UDS of 297. She endorses history of alcohol dependency, self medicating for depression and anxiety.  Pt triggered by recent breakup and homelessness, concerned that mounting psychosocial  stressors put her a higher potential for self harm.   She has no social support and no identified protective factors. [facility]  [address] where she can detox from alcohol and be treated for depression/anxiety.  Pt agrees to this.   She will need an updated EKG. Her labs are WNL, to start antidepressants.  Will also start CIWA-Ativan protocol.  Meds:  Fluoxetine 10mg  po qhs with plan to optimize for mood stability CIWA-Ativan protocol Trazodone 50mg  po qhs prn sleep  Disposition: No evidence of imminent risk to self or others at present.    Patient does not meet criteria for psychiatric inpatient admission.  Spoke with 10/26/2021 who agrees to accept patient in OBS with goal of transfer to [facility] once a bed becomes available.   This service was provided via telemedicine using a 2-way, interactive audio and video technology.  Names of all persons participating in this telemedicine service and their role in this encounter. Name: Yaiza Almeda Role: Patient   Name: Role: PMHNP    Tilford Pillar, NP 10/25/2021 2:14 PM

## 2021-10-25 NOTE — ED Notes (Signed)
Pt belongings transfer with pt

## 2021-10-25 NOTE — ED Notes (Signed)
Patient reports that she "is feeling much better" . Patient denies SI, HI, AVH. She is calm and cooperative. Ate a snack earlier. Will continue to monitor for safety.

## 2021-10-25 NOTE — BH Assessment (Signed)
Comprehensive Clinical Assessment (CCA) Note  10/25/2021 Veronica Le [id number]  Disposition: Veronica Reichert, NP recommends pt to be observed and reassessed by psychiatry. Disposition discussed with Veronica A. Arline Asp, RN via secure chat.   Flowsheet Row ED from 10/24/2021 in [place] DEPT ED from 01/01/2021 in [place] ED from 06/15/2020 in [place] Moderate Risk No Risk No Risk      The patient demonstrates the following risk factors for suicide: Chronic risk factors for suicide include: GAD. Acute risk factors for suicide include:  Pt denies, SI . Protective factors for this patient include:  Pt denies, SI . Considering these factors, the overall suicide risk at this point appears to be moderate. Patient is not appropriate for outpatient follow up.  Veronica Le is a 46 year old female who presents voluntary and unaccompanied to [facility]. Clinician asked the pt, "what brought you to the hospital?" Pt reports, "I'm tried." Pt reports, she's mad at God, she doesn't understand this world. Pt reports, "I'm tired, sick of trying to figure it out, tired of feeling out of place, get my footing and thrive, I know I have the capacity and ability." Pt reports, she very anxious and feels like she annoys people. Pt reports, she doesn't feel like she belong on this planet but she doesn't want to kill herself. Pt reports, she wants God to blow up the planet. Per pt, "I just don't understand this planet." Clinician asked the pt she wanted to hurt or kill anyone. Pt reports, she wanted to hurt and kill her baby daddies. Clinician repeated pt's response to verify. Pt then reported, she doesn't want to kill them and she wasn't mad at them. Pt reports, while walking she felt anxious called 911, EMS brought her to the hospital. Clinician asked the pt if she's experienced AVH. Pt reports,  "I don't know, I see a lot of stuff, if I see it it's real to me." Pt reports, she has spiritual experiences. Pt denies, SI, HI, assess to weapons.   Per EDP/PA note, the pt drank a bottle of wine. Pt's BAL was 297 at 1547. Pt reports, she has been drinking more lately. Pt's UDS is negative. Per EDP/PA note, the pt's therapist passed away and she is in the process of finding a new therapist.  At the beginning of the assessment pt was tearful and expressed she felt safe with clinician. During the assessment, the pt was tangential with loose associations, as clinician tried asking questions the pt became irritable. Clinician expressed she wanted to verify what she said and the questions asked is part of her assessment. Pt appeared to understand, she then reported she felt clinician did not care and was making her feel restless; the pt left the assessment. Pt's mood was anxious, sad, irritable. Pt's affect was congruent. Pt's insight was lacking. Pt's judgement was poor.   Diagnosis: GAD.   Chief Complaint:  Chief Complaint  Patient presents with   Suicidal   Homicidal   Alcohol Intoxication   Visit Diagnosis:     CCA Screening, Triage and Referral (STR)  Patient Reported Information How did you hear about [place]? Other (Comment) (EMS.)  What Is the Reason for Your Visit/Call Today? Per EDP/PA note: "is a 46 y.o. female with a history of anxiety presents to the ED due to suicidal and homicidal ideations. Patient states she has been feeling extremely anxious over the past few  days. She is in the process of finding a therapist because her recent 1 died. Patient admits to drinking 1 bottle of wine earlier today. Denies drug use. Very difficult to obtain HPI due to tangential speech.  How Long Has This Been Causing You Problems? -- (UTA)  What Do You Feel Would Help You the Most Today? Alcohol or Drug Use Treatment; Stress Management; Medication(s); Treatment for Depression or other mood  problem   Have You Recently Had Any Thoughts About Hurting Yourself? No (Pt denies.)  Are You Planning to Commit Suicide/Harm Yourself At This time? No   Have you Recently Had Thoughts About [place]? -- (Pt reports, she wanted to hurt, kill her baby daddies then reported she doesn't want to kill them.)  Are You Planning to Harm Someone at This Time? No  Explanation: No data recorded  Have You Used Any Alcohol or Drugs in the Past 24 Hours? Yes  How Long Ago Did You Use Drugs or Alcohol? No data recorded What Did You Use and How Much? Per EDP/PA note, the pt drank a bottle of wine. Pt's BAL was 297 at 1547. Pt reports, she has been drinking more lately.   Do You Currently Have a Therapist/Psychiatrist? -- (Per EDP/PA note, the pt's therapist passed away and she is in the process of finding a new therapist.)  Name of Therapist/Psychiatrist: No data recorded  Have You Been Recently Discharged From Any Office Practice or Programs? -- (UTA)  Explanation of Discharge From Practice/Program: No data recorded    CCA Screening Triage Referral Assessment Type of Contact: Tele-Assessment  Telemedicine Service Delivery: Telemedicine service delivery: This service was provided via telemedicine using a 2-way, interactive audio and video technology  Is this Initial or Reassessment? Initial Assessment  Date Telepsych consult ordered in [facility]:  10/24/21  Time Telepsych consult ordered in [facility]:  1839  Location of Assessment: [facility]  Provider Location: [facility] Assessment Services   Collateral Involvement: None.   Does Patient Have a Stage manager Guardian? No  Legal Guardian Contact Information: No data recorded Copy of Legal Guardianship Form: No data recorded Legal Guardian Notified of Arrival: No data recorded Legal Guardian Notified of Pending Discharge: No data recorded If Minor and Not Living with Parent(s), Who has Custody? No data recorded Is CPS involved or  ever been involved? -- (UTA)  Is APS involved or ever been involved? -- (UTA)   Patient Determined To Be At Risk for Harm To Self or Others Based on Review of Patient Reported Information or Presenting Complaint? No (Pt denies.)  Method: No data recorded Availability of Means: No data recorded Intent: No data recorded Notification Required: No data recorded Additional Information for Danger to Others Potential: No data recorded Additional Comments for Danger to Others Potential: No data recorded Are There Guns or Other Weapons in Your Home? No data recorded Types of Guns/Weapons: No data recorded Are These Weapons Safely Secured?                            No data recorded Who Could Verify You Are Able To Have These Secured: No data recorded Do You Have any Outstanding Charges, Pending Court Dates, Parole/Probation? No data recorded Contacted To Inform of Risk of Harm To Self or Others: No data recorded   Does Patient Present under Involuntary Commitment? No  IVC Papers Initial File Date: No data recorded  [place] of Residence: [place]  Patient Currently Receiving the Following Services: Not Receiving Services   Determination of Need: Urgent (48 hours)   Options For Referral: Inpatient Hospitalization; Outpatient Therapy; Partial Hospitalization; [facility] Urgent Care; Facility-Based Crisis     CCA Biopsychosocial Patient Reported Schizophrenia/Schizoaffective Diagnosis in Past: -- (UTA)   Strengths: No data recorded  Mental Health Symptoms Depression:   Fatigue; Tearfulness; Difficulty Concentrating (Isolation.)   Duration of Depressive symptoms:    Mania:  No data recorded  Anxiety:    Worrying; Tension; Sleep; Restlessness; Irritability; Fatigue; Difficulty concentrating (Pt reports, having a panic attack today.)   Psychosis:   None   Duration of Psychotic symptoms:    Trauma:   -- (UTA)   Obsessions:   -- (UTA)   Compulsions:   -- (UTA)   Inattention:    Disorganized   Hyperactivity/Impulsivity:   Feeling of restlessness; Fidgets with hands/feet   Oppositional/Defiant Behaviors:   Angry   Emotional Irregularity:  No data recorded  Other Mood/Personality Symptoms:   Pt reports, feeling alone    Mental Status Exam Appearance and self-care  Stature:   Average   Weight:   Average weight   Clothing:   -- (Pt in scrubs.)   Grooming:   Normal   Cosmetic use:   None   Posture/gait:   Normal   Motor activity:   Restless   Sensorium  Attention:   Distractible; Inattentive   Concentration:   Focuses on irrelevancies   Orientation:   Person; Place; Situation   Recall/memory:   Defective in Immediate   Affect and Mood  Affect:   Congruent   Mood:   Depressed; Irritable; Anxious   Relating  Eye contact:   Avoided   Facial expression:   Tense; Anxious   Attitude toward examiner:   Irritable; Cooperative   Thought and Language  Speech flow:  Other (Comment) (Tangential.)   Thought content:   -- (UTA)   Preoccupation:   Other (Comment) (UTA)   Hallucinations:   None   Organization:  No data recorded  [employer] of Knowledge:   Fair   Intelligence:   Average   Abstraction:   -- (UTA)   Judgement:   Poor   Reality Testing:   -- (UTA)   Insight:   Lacking   Decision Making:   -- (UTA)   Social Functioning  Social Maturity:   Isolates   Social Judgement:   -- (UTA)   Stress  Stressors:   Other (Comment)   Coping Ability:   Overwhelmed   Skill Deficits:   Communication; Decision making   Supports:   Other (Comment) (UTA)     Religion: Religion/Spirituality Are You A Religious Person?:  (Pt reports, she a new believer in [place].)  Leisure/Recreation: Leisure / Recreation Do You Have Hobbies?:  (UTA)  Exercise/Diet: Exercise/Diet Do You Exercise?:  (UTA) Do You Follow a Special Diet?: No Do You Have Any Trouble Sleeping?: No   CCA  Employment/Education Employment/Work Situation: Employment / Work 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 Pt reports, she was married for 21 years. What types of issues is patient dealing with in the relationship?: UTA Additional relationship information: UTA Does patient have children?: Yes How many children?: 4 How is patient's relationship with their children?: Pt reports, she has two adult children and her two younger children live with their father.  Childhood History:  Childhood History By whom was/is the patient raised?: Other (Comment) (UTA) Did patient suffer any verbal/emotional/physical/sexual abuse as a child?:  (UTA.) Did patient suffer from severe childhood neglect?:  (UTA) Has patient ever been sexually abused/assaulted/raped as an adolescent or adult?:  (UTA) Was the patient ever a victim of a crime or a disaster?:  (UTA) Witnessed domestic violence?:  (UTA)  Child/Adolescent Assessment:     CCA Substance Use Alcohol/Drug Use: Alcohol / Drug Use Pain Medications: See MAR Prescriptions: See MAR Over the Counter: See MAR History of alcohol / drug use?: Yes Substance #1 Name of Substance 1: Alochol. 1 - Age of First Use: Per chart, pt drank a bottle of wine today. Pt's BAL was 297 at 1547. 1 - Amount (size/oz): Pt reports, she's been drinking more lately. 1 - Frequency: Ongoing. 1 - Duration: Ongoing. 1 - Last Use / Amount:  10/24/2021. 1 - Method of Aquiring: Purchase. 1- Route of Use: Oral.    ASAM's:  Six Dimensions of Multidimensional Assessment  Dimension 1:  Acute Intoxication and/or Withdrawal Potential:   Dimension 1:  Description of individual's past and current experiences of substance use and withdrawal: During the assessment pt's substance use was not discussed much.  Dimension 2:  Biomedical Conditions and Complications:   Dimension 2:  Description of patient's biomedical conditions and  complications: Pt reports, her Potassium was low.  Dimension 3:  Emotional, Behavioral, or Cognitive Conditions and Complications:  Dimension 3:  Description of emotional, behavioral, or cognitive conditions and complications: Pt reports, she's very anxious and had a pain attack today.  Dimension 4:  Readiness to Change:  Dimension 4:  Description of Readiness to Change criteria: During the assessment maintaining sobriety was not mentioned or disciussed.  Dimension 5:  Relapse, Continued use, or Continued Problem Potential:  Dimension 5:  Relapse, continued use, or continued problem potential critiera description: Pt reports, she's been drinking more.  Dimension 6:  Recovery/Living Environment:  Dimension 6:  Recovery/Iiving environment criteria description: UTA  ASAM Severity Score:    ASAM Recommended Level of Treatment:     Substance use Disorder (SUD) Substance Use Disorder (SUD)  Checklist Symptoms of Substance Use: Continued use despite having a persistent/recurrent physical/psychological problem caused/exacerbated by use  Recommendations for Services/Supports/Treatments: Recommendations for Services/Supports/Treatments Recommendations For Services/Supports/Treatments: Other (Comment) (Pt to be observed and reassessed by psychiatry.)  Discharge Disposition:    DSM5 Diagnoses: Patient Active Problem List   Diagnosis Date Noted   Ankle fracture, left 11/01/2019   AMA (advanced maternal age) multigravida  72+, third trimester 12/02/2016     Referrals to Alternative Service(s): Referred to Alternative Service(s):   Place:   Date:   Time:    Referred to Alternative Service(s):   Place:   Date:   Time:    Referred to Alternative Service(s):   Place:   Date:   Time:    Referred to Alternative Service(s):   Place:   Date:   Time:     Vertell Novak, [facility] Comprehensive Clinical Assessment (CCA) Screening, Triage and Referral Note  10/25/2021 Veronica Le [id number]  Chief Complaint:  Chief  Complaint  Patient presents with   Suicidal   Homicidal   Alcohol Intoxication   Visit Diagnosis:   Patient Reported Information How did you hear about [place]? Other (Comment) (EMS.)  What Is the Reason for Your Visit/Call Today? Per EDP/PA note: "is a 46 y.o. female with a history of anxiety presents to the ED due to suicidal and homicidal ideations. Patient states she has been feeling extremely anxious over the past few days. She is in the process of finding a therapist because her recent 1 died. Patient admits to drinking 1 bottle of wine earlier today. Denies drug use. Very difficult to obtain HPI due to tangential speech.  How Long Has This Been Causing You Problems? -- (UTA)  What Do You Feel Would Help You the Most Today? Alcohol or Drug Use Treatment; Stress Management; Medication(s); Treatment for Depression or other mood problem   Have You Recently Had Any Thoughts About Hurting Yourself? No (Pt denies.)  Are You Planning to Commit Suicide/Harm Yourself At This time? No   Have you Recently Had Thoughts About [place]? -- (Pt reports, she wanted to hurt, kill her baby daddies then reported she doesn't want to kill them.)  Are You Planning to Harm Someone at This Time? No  Explanation: No data recorded  Have You Used Any Alcohol or Drugs in the Past 24 Hours? Yes  How Long Ago Did You Use Drugs or Alcohol? No data recorded What Did You Use and How Much? Per EDP/PA  note, the pt drank a bottle of wine. Pt's BAL was 297 at 1547. Pt reports, she has been drinking more lately.   Do You Currently Have a Therapist/Psychiatrist? -- (Per EDP/PA note, the pt's therapist passed away and she is in the process of finding a new therapist.)  Name of Therapist/Psychiatrist: No data recorded  Have You Been Recently Discharged From Any Office Practice or Programs? -- (UTA)  Explanation of Discharge From Practice/Program: No data recorded   CCA Screening Triage Referral Assessment Type of Contact: Tele-Assessment  Telemedicine Service Delivery: Telemedicine service delivery: This service was provided via telemedicine using a 2-way, interactive audio and video technology  Is this Initial or Reassessment? Initial Assessment  Date Telepsych consult ordered in [facility]:  10/24/21  Time Telepsych consult ordered in [facility]:  1839  Location of Assessment: [facility]  Provider Location: [facility] Assessment Services   Collateral Involvement: None.   Does Patient Have a Stage manager Guardian? No data recorded Name and Contact of Legal Guardian: No data recorded If Minor and Not Living with Parent(s), Who has Custody? No data recorded Is CPS involved or ever been involved? -- (UTA)  Is APS involved or ever been involved? -- (UTA)   Patient Determined To Be At Risk for Harm To Self or Others Based on Review of Patient Reported Information or Presenting Complaint? No (Pt denies.)  Method: No data recorded Availability of Means: No data recorded Intent: No data recorded Notification Required: No data recorded Additional Information for Danger to Others Potential: No data recorded Additional Comments for Danger to Others Potential: No data recorded Are There Guns or Other Weapons in Your Home? No data recorded Types of Guns/Weapons: No data recorded Are These Weapons Safely Secured?                            No data recorded Who Could Verify You Are Able To Have  These  Secured: No data recorded Do You Have any Outstanding Charges, Pending Court Dates, Parole/Probation? No data recorded Contacted To Inform of Risk of Harm To Self or Others: No data recorded  Does Patient Present under Involuntary Commitment? No  IVC Papers Initial File Date: No data recorded  [place] of Residence: [place]   Patient Currently Receiving the Following Services: Not Receiving Services   Determination of Need: Urgent (48 hours)   Options For Referral: Inpatient Hospitalization; Outpatient Therapy; Partial Hospitalization; [facility] Urgent Care; Facility-Based Crisis   Discharge Disposition:     Vertell Novak, [place], [place], [facility], [facility] Triage Specialist [phone]

## 2021-10-26 MED ORDER — FLUOXETINE HCL 10 MG PO CAPS
10.0000 mg | ORAL_CAPSULE | Freq: Every day | ORAL | Status: DC
  Administered 2021-10-26 – 2021-10-30 (×5): 10 mg via ORAL
  Filled 2021-10-26: qty 1
  Filled 2021-10-26: qty 14
  Filled 2021-10-26 (×4): qty 1

## 2021-10-26 NOTE — ED Notes (Addendum)
Patient stated she slept well last night and appeared tearful this morning due to "feeling grateful." Patient stated "I do not know where I'm going but I am grateful that I am safe." Patient states she is grateful but worried at the same time and feels thankful to be at a safe place to rest her head and receive help. Patient denies active SI, HI, and A/V/H with no plan/intent. Denies any pain/discomfort. CIWA score 1. Med compliant. No distress noted.

## 2021-10-26 NOTE — ED Notes (Signed)
Patient is resting quietly in bed. CIWA score = 1. Patient has no complaints. Will continue to monitor for safety.

## 2021-10-26 NOTE — ED Notes (Signed)
Pt is A & O x 4. Pt is calm and cooperative, lying on stretcher.  Denies SI/HI/AVH and any pain. Will continue to monitor.

## 2021-10-26 NOTE — ED Notes (Signed)
Patient is resting quietly with eyes closed no S/S of distress. Respirations even and unlabored will continue to monitor for safety.

## 2021-10-26 NOTE — ED Provider Notes (Signed)
Behavioral Health Progress Note  Date and Time: 10/26/2021 10:29 AM Name: Veronica Le MRN:  [id number]  Subjective:  Veronica Le was seen and evaluated face-to-face.  She presents with a bright and pleasant affect.  Denying suicidal or homicidal ideations.  Denies auditory visual hallucinations.  Patient is awaiting a bed in the facility based crisis center due to substance abuse with alcohol.  Currently on Ativan protocol.  She denied tremors or cravings.  Reports she is eager to follow-up with social worker as she reports she is homeless and does not have many resources available.    Veronica Le  states " I do not know what I am going to do when I leave here, I am scared to find out."  Denied that she is currently followed by therapy and/or psychiatry.  Patient was initiated on Prozac which she reports taking and tolerating well.  No charted disruptive behaviors.  Support,  encouragement and reassurance have been provided.   Diagnosis:  Final diagnoses:  Substance induced mood disorder (HCC)  Adjustment disorder with mixed anxiety and depressed mood    Total Time spent with patient: 15 minutes  Past Psychiatric History:  Past Medical History:  Past Medical History:  Diagnosis Date   Medical history non-contributory    Panic attack     Past Surgical History:  Procedure Laterality Date   NO PAST SURGERIES     ORIF ANKLE FRACTURE Left 11/01/2019   Procedure: OPEN REDUCTION INTERNAL FIXATION (ORIF) LEFT ANKLE FRACTURE;  Surgeon: Cammy Copa, MD;  Location: [facility] OR;  Service: Orthopedics;  Laterality: Left;   Family History: No family history on file. Family Psychiatric  History:  Social History:  Social History   Substance and Sexual Activity  Alcohol Use Yes     Social History   Substance and Sexual Activity  Drug Use Yes   Types: Marijuana    Social History   Socioeconomic History   Marital status: Divorced    Spouse name: Not on file   Number of children: Not on  file   Years of education: Not on file   Highest education level: Not on file  Occupational History   Not on file  Tobacco Use   Smoking status: Some Days    Types: Cigarettes   Smokeless tobacco: Never  Vaping Use   Vaping Use: Never used  Substance and Sexual Activity   Alcohol use: Yes   Drug use: Yes    Types: Marijuana   Sexual activity: Yes    Birth control/protection: None  Other Topics Concern   Not on file  Social History Narrative   Not on file   Social Determinants of Health   Financial Resource Strain: Not on file  Food Insecurity: Not on file  Transportation Needs: Not on file  Physical Activity: Not on file  Stress: Not on file  Social Connections: Not on file   SDOH:  SDOH Screenings   Tobacco Use: High Risk (10/24/2021)   Additional Social History:                         Sleep: Good  Appetite:  Good  Current Medications:  Current Facility-Administered Medications  Medication Dose Route Frequency Provider Last Rate Last Admin   acetaminophen (TYLENOL) tablet 650 mg  650 mg Oral Q6H PRN Oneta Rack, NP       alum & mag hydroxide-simeth (MAALOX/MYLANTA) 200-200-20 MG/5ML suspension 30 mL  30 mL Oral Q4H PRN Melvyn Neth,  Jerene Pitch, NP       hydrOXYzine (ATARAX) tablet 25 mg  25 mg Oral Q6H PRN Oneta Rack, NP       loperamide (IMODIUM) capsule 2-4 mg  2-4 mg Oral PRN Oneta Rack, NP       LORazepam (ATIVAN) tablet 1 mg  1 mg Oral Q6H PRN Oneta Rack, NP       LORazepam (ATIVAN) tablet 1 mg  1 mg Oral QID Oneta Rack, NP   1 mg at 10/26/21 1005   Followed by   Melene Muller ON 10/27/2021] LORazepam (ATIVAN) tablet 1 mg  1 mg Oral TID Oneta Rack, NP       Followed by   Melene Muller ON 10/28/2021] LORazepam (ATIVAN) tablet 1 mg  1 mg Oral BID Oneta Rack, NP       Followed by   Melene Muller ON 10/29/2021] LORazepam (ATIVAN) tablet 1 mg  1 mg Oral Daily Oneta Rack, NP       magnesium hydroxide (MILK OF MAGNESIA) suspension 30 mL  30  mL Oral Daily PRN Oneta Rack, NP       multivitamin with minerals tablet 1 tablet  1 tablet Oral Daily Oneta Rack, NP   1 tablet at 10/26/21 1005   ondansetron (ZOFRAN-ODT) disintegrating tablet 4 mg  4 mg Oral Q6H PRN Oneta Rack, NP       thiamine (VITAMIN B1) tablet 100 mg  100 mg Oral Daily Oneta Rack, NP   100 mg at 10/26/21 1005   traZODone (DESYREL) tablet 50 mg  50 mg Oral QHS Oneta Rack, NP   50 mg at 10/25/21 2102   Current Outpatient Medications  Medication Sig Dispense Refill   celecoxib (CELEBREX) 200 MG capsule Take 1 capsule (200 mg total) by mouth 2 (two) times daily. (Patient not taking: Reported on 10/24/2021) 28 capsule 0   [employer] ASPIRIN ADULT LOW DOSE 81 MG chewable tablet CHEW 1 TABLET (81 MG TOTAL) BY MOUTH 2 (TWO) TIMES DAILY. (Patient not taking: Reported on 10/24/2021) 60 tablet 0   LORazepam (ATIVAN) 1 MG tablet Take 3 tablets PO on day one, take 2 tablets PO on day two, take 1 tablet PO on day one then STOP (Patient not taking: Reported on 10/24/2021) 6 tablet 0    Labs  Lab Results:  Admission on 10/24/2021, Discharged on 10/25/2021  Component Date Value Ref Range Status   SARS Coronavirus 2 by RT PCR 10/24/2021 NEGATIVE  NEGATIVE Final   Comment: (NOTE) SARS-CoV-2 target nucleic acids are NOT DETECTED.  The SARS-CoV-2 RNA is generally detectable in upper respiratory specimens during the acute phase of infection. The lowest concentration of SARS-CoV-2 viral copies this assay can detect is 138 copies/mL. A negative result does not preclude SARS-Cov-2 infection and should not be used as the sole basis for treatment or other patient management decisions. A negative result may occur with  improper specimen collection/handling, submission of specimen other than nasopharyngeal swab, presence of viral mutation(s) within the areas targeted by this assay, and inadequate number of viral copies(<138 copies/mL). A negative result must be combined  with clinical observations, patient history, and epidemiological information. The expected result is Negative.  Fact Sheet for Patients:  BloggerCourse.com  Fact Sheet for Healthcare Providers:  SeriousBroker.it  This test is no  t yet approved or cleared by the [place] and  has been authorized for detection and/or diagnosis of SARS-CoV-2 by FDA under an Emergency Use Authorization (EUA). This EUA will remain  in effect (meaning this test can be used) for the duration of the COVID-19 declaration under Section 564(b)(1) of the Act, 21 U.S.C.section 360bbb-3(b)(1), unless the authorization is terminated  or revoked sooner.       Influenza A by PCR 10/24/2021 NEGATIVE  NEGATIVE Final   Influenza B by PCR 10/24/2021 NEGATIVE  NEGATIVE Final   Comment: (NOTE) The Xpert Xpress SARS-CoV-2/FLU/RSV plus assay is intended as an aid in the diagnosis of influenza from Nasopharyngeal swab specimens and should not be used as a sole basis for treatment. Nasal washings and aspirates are unacceptable for Xpert Xpress SARS-CoV-2/FLU/RSV testing.  Fact Sheet for Patients: EntrepreneurPulse.com.au  Fact Sheet for Healthcare Providers: IncredibleEmployment.be  This test is not yet approved or cleared by the [place] FDA and has been authorized for detection and/or diagnosis of SARS-CoV-2 by FDA under an Emergency Use Authorization (EUA). This EUA will remain in effect (meaning this test can be used) for the duration of the COVID-19 declaration under Section 564(b)(1) of the Act, 21 U.S.C. section 360bbb-3(b)(1), unless the authorization is terminated or revoked.  Performed at [facility], [place] [address] [place], [place] [address]    Sodium 10/24/2021 138  135 - 145 mmol/L Final   Potassium 10/24/2021 3.1 (L)  3.5 - 5.1 mmol/L Final   Chloride  10/24/2021 105  98 - 111 mmol/L Final   CO2 10/24/2021 22  22 - 32 mmol/L Final   Glucose, Bld 10/24/2021 82  70 - 99 mg/dL Final   Glucose reference range applies only to samples taken after fasting for at least 8 hours.   BUN 10/24/2021 11  6 - 20 mg/dL Final   Creatinine, Ser 10/24/2021 0.64  0.44 - 1.00 mg/dL Final   Calcium 10/24/2021 8.3 (L)  8.9 - 10.3 mg/dL Final   Total Protein 10/24/2021 7.6  6.5 - 8.1 g/dL Final   Albumin 10/24/2021 4.4  3.5 - 5.0 g/dL Final   AST 10/24/2021 57 (H)  15 - 41 U/L Final   ALT 10/24/2021 37  0 - 44 U/L Final   Alkaline Phosphatase 10/24/2021 60  38 - 126 U/L Final   Total Bilirubin 10/24/2021 0.6  0.3 - 1.2 mg/dL Final   GFR, Estimated 10/24/2021 >60  >60 mL/min Final   Comment: (NOTE) Calculated using the CKD-EPI Creatinine Equation (2021)    Anion gap 10/24/2021 11  5 - 15 Final   Performed at [facility], [place] [address] [place], [place] [address]   Alcohol, Ethyl (B) 10/24/2021 297 (H)  <10 mg/dL Final   Comment: (NOTE) Lowest detectable limit for serum alcohol is 10 mg/dL.  For medical purposes only. Performed at [facility], [place] [address], [place], [place] [address]    Opiates 10/24/2021 NONE DETECTED  NONE DETECTED Final   Cocaine 10/24/2021 NONE DETECTED  NONE DETECTED Final   Benzodiazepines 10/24/2021 NONE DETECTED  NONE DETECTED Final   Amphetamines 10/24/2021 NONE DETECTED  NONE DETECTED Final   Tetrahydrocannabinol 10/24/2021 NONE DETECTED  NONE DETECTED Final   Barbiturates 10/24/2021 NONE DETECTED  NONE DETECTED Final   Comment: (NOTE) DRUG SCREEN FOR MEDICAL PURPOSES ONLY.  IF CONFIRMATION IS NEEDED FOR ANY PURPOSE, NOTIFY LAB WITHIN 5 DAYS.  LOWEST DETECTABLE LIMITS FOR URINE DRUG SCREEN Drug Class  Cutoff (ng/mL) Amphetamine and metabolites    1000 Barbiturate and metabolites    200 Benzodiazepine                 200 Opiates and metabolites         300 Cocaine and metabolites        300 THC                            50 Performed at [facility], [address], [place], [place] [address]    WBC 10/24/2021 3.8 (L)  4.0 - 10.5 K/uL Final   RBC 10/24/2021 3.56 (L)  3.87 - 5.11 MIL/uL Final   Hemoglobin 10/24/2021 12.5  12.0 - 15.0 g/dL Final   HCT 77/41/2878 36.7  36.0 - 46.0 % Final   MCV 10/24/2021 103.1 (H)  80.0 - 100.0 fL Final   MCH 10/24/2021 35.1 (H)  26.0 - 34.0 pg Final   MCHC 10/24/2021 34.1  30.0 - 36.0 g/dL Final   RDW 67/67/2094 11.8  11.5 - 15.5 % Final   Platelets 10/24/2021 211  150 - 400 K/uL Final   nRBC 10/24/2021 0.0  0.0 - 0.2 % Final   Neutrophils Relative % 10/24/2021 46  % Final   Neutro Abs 10/24/2021 1.7  1.7 - 7.7 K/uL Final   Lymphocytes Relative 10/24/2021 46  % Final   Lymphs Abs 10/24/2021 1.7  0.7 - 4.0 K/uL Final   Monocytes Relative 10/24/2021 7  % Final   Monocytes Absolute 10/24/2021 0.3  0.1 - 1.0 K/uL Final   Eosinophils Relative 10/24/2021 0  % Final   Eosinophils Absolute 10/24/2021 0.0  0.0 - 0.5 K/uL Final   Basophils Relative 10/24/2021 1  % Final   Basophils Absolute 10/24/2021 0.0  0.0 - 0.1 K/uL Final   Immature Granulocytes 10/24/2021 0  % Final   Abs Immature Granulocytes 10/24/2021 0.00  0.00 - 0.07 K/uL Final   Performed at [facility], [address] [place], [place] [address]   I-stat hCG, quantitative 10/24/2021 <5.0  <5 mIU/mL Final   Comment 3 10/24/2021          Final   Comment:   GEST. AGE      CONC.  (mIU/mL)   <=1 WEEK        5 - 50     2 WEEKS       50 - 500     3 WEEKS       100 - 10,000     4 WEEKS     1,000 - 30,000        FEMALE AND NON-PREGNANT FEMALE:     LESS THAN 5 mIU/mL    Acetaminophen (Tylenol), Serum 10/24/2021 <10 (L)  10 - 30 ug/mL Final   Comment: (NOTE) Therapeutic concentrations vary significantly. A range of 10-30 ug/mL  may be an effective concentration for many patients. However, some  are best  treated at concentrations outside of this range. Acetaminophen concentrations >150 ug/mL at 4 hours after ingestion  and >50 ug/mL at 12 hours after ingestion are often associated with  toxic reactions.  Performed at [facility], [address], [place], [place] [address]    Salicylate Lvl 10/24/2021 <7.0 (L)  7.0 - 30.0 mg/dL Final   Performed at [facility], [address], [place], [place] [address]    Blood Alcohol level:  Lab  Results  Component Value Date   ETH 297 (H) 10/24/2021   ETH 266 (H) 11/01/2019    Metabolic Disorder Labs: No results found for: "HGBA1C", "MPG" No results found for: "PROLACTIN" No results found for: "CHOL", "TRIG", "HDL", "CHOLHDL", "VLDL", "LDLCALC"  Therapeutic Lab Levels: No results found for: "LITHIUM" No results found for: "VALPROATE" No results found for: "CBMZ"  Physical Findings   CAGE-AID    Flowsheet Row ED to Hosp[facility]Ad[facility] (Discharged[facility]  CAGE-AID Score 0      Flowsheet [facility]  C-SSRS RISK CATEGORY Low Risk Moderate Risk No Risk        Musculoskeletal  Strength & Muscle Tone: within normal limits Gait & Station: normal Patient leans: N/A  Psychiatric Specialty Exam  Presentation  General Appearance: No data recorded Eye Contact:No data recorded Speech:No data recorded Speech Volume:No data recorded Handedness:No data recorded  Mood and Affect  Mood: Anxious; Worthless; Labile  Affect: Congruent; Depressed; Labile   Thought Process  Thought Processes: Coherent; Goal Directed  Descriptions of Associations:Circumstantial  Orientation:Full (Time, Place and Person)  Thought Content:Rumination  Diagnosis of  Schizophrenia or Schizoaffective disorder in past: No    Hallucinations:Hallucinations: None  Ideas of Reference:None  Suicidal Thoughts:No data recorded Homicidal Thoughts:Homicidal Thoughts: No   Sensorium  Memory: Immediate Good; Recent Good  Judgment: -- (impuslive)  Insight: Fair   Art therapistxecutive Functions  Concentration: Fair  Attention Span: Fair  Recall: Good  Fund of Knowledge: Good  Language: Good   Psychomotor Activity  Psychomotor Activity: Psychomotor Activity: Normal   Assets  Assets: Communication Skills; Desire for Improvement; Resilience   Sleep  Sleep: Number of Hours of Sleep: 5   No data recorded  Physical Exam  Physical Exam Vitals and nursing note reviewed.  Cardiovascular:     Rate and Rhythm: Normal rate and regular rhythm.  Pulmonary:     Effort: Pulmonary effort is normal.     Breath sounds: Normal breath sounds.  Neurological:     Mental Status: She is alert and oriented to person, place, and time.  Psychiatric:        Mood and Affect: Mood normal.        Thought Content: Thought content normal.    Review of Systems  Cardiovascular: Negative.   Genitourinary: Negative.   Skin: Negative.   Endo/Heme/Allergies: Negative.   Psychiatric/Behavioral:  Positive for depression. Negative for suicidal ideas. The patient is nervous/anxious.   All other systems reviewed and are negative.  Blood pressure 115/74, pulse 67, temperature 97.8 F (36.6 C), temperature source Oral, resp. rate 16, last menstrual period 10/24/2021, SpO2 99 %, unknown if currently breastfeeding. There is no height or weight on file to calculate BMI.  Treatment Plan Summary: Daily contact with patient to assess and evaluate symptoms and progress in treatment and Medication management  -Anticipated admission to facility based crisis (FBC) -Continue Prozac 10 mg daily -Continue Ativan/CIWA taper  -CSW to assist with additional outpatient resources  for substance abuse and housing  Oneta Rackanika N Alysah Carton, NP 10/26/2021 10:29 AM

## 2021-10-26 NOTE — ED Notes (Signed)
Meal received 

## 2021-10-26 NOTE — ED Notes (Signed)
Patient was moved from flex unit to adult observation to accommodate a new admission. Patient now resting quietly no complaints at this time

## 2021-10-27 ENCOUNTER — Encounter (HOSPITAL_COMMUNITY): Payer: Self-pay

## 2021-10-27 DIAGNOSIS — Z5900 Homelessness unspecified: Secondary | ICD-10-CM | POA: Diagnosis not present

## 2021-10-27 DIAGNOSIS — F430 Acute stress reaction: Secondary | ICD-10-CM | POA: Diagnosis present

## 2021-10-27 DIAGNOSIS — F4323 Adjustment disorder with mixed anxiety and depressed mood: Secondary | ICD-10-CM | POA: Diagnosis not present

## 2021-10-27 DIAGNOSIS — F1994 Other psychoactive substance use, unspecified with psychoactive substance-induced mood disorder: Secondary | ICD-10-CM | POA: Diagnosis not present

## 2021-10-27 LAB — TSH: TSH: 1.177 u[IU]/mL (ref 0.350–4.500)

## 2021-10-27 MED ORDER — TRAZODONE HCL 50 MG PO TABS: 50.0000 mg | ORAL_TABLET | Freq: Every evening | ORAL | Status: DC | PRN

## 2021-10-27 MED ORDER — NICOTINE 14 MG/24HR TD PT24
14.0000 mg | MEDICATED_PATCH | Freq: Every day | TRANSDERMAL | Status: DC
  Filled 2021-10-27 (×2): qty 1
  Filled 2021-10-27: qty 14

## 2021-10-27 MED ORDER — NALTREXONE HCL 50 MG PO TABS
50.0000 mg | ORAL_TABLET | Freq: Every day | ORAL | Status: DC
  Administered 2021-10-27 – 2021-10-29 (×3): 50 mg via ORAL
  Filled 2021-10-27: qty 1
  Filled 2021-10-27: qty 14
  Filled 2021-10-27 (×2): qty 1

## 2021-10-27 NOTE — ED Notes (Signed)
Obtained blood draw to L AC x1 stick successfully. Pt tolerated it well. Safety maintained. DASH called to pick up specimen.

## 2021-10-27 NOTE — ED Notes (Signed)
Pt is in the bed sleeping. Respirations are even and unlabored. No acute distress noted. Will continue to monitor for safety. 

## 2021-10-27 NOTE — ED Notes (Signed)
Pt transferred from observation unit to [facility] requesting ETOH detox. Pt states, "I was drinking a lot, a lot (laughing) and it's time to stop. I have nowhere to live so how long can you stay here?". Informed pt of the criteria for admission to [facility] to include a 3-5 day stay. Pt states she want to go to a long term facility: Pt denies SI/HI/AVH. Talkative throughout interview process. Slow in nature.  Calm, cooperative throughout interview process. Skin assessment completed. Oriented to unit. Meal and drink offered. Pt verbally contract for safety. Will monitor for safety.

## 2021-10-27 NOTE — ED Notes (Addendum)
Pt is sleeping at present. No distress noted. Will continue to monitor safety.
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
## 2021-10-27 NOTE — ED Notes (Signed)
Pt on phone in no acute distress. Denies SI/HI/AVH. Denies needs or concerns at present. Informed pt to notify staff with any needs. Will continue to monitor for safety.

## 2021-10-27 NOTE — ED Notes (Signed)
Pt sleeping at present. No distress noted. Will continue to monitor. 

## 2021-10-28 DIAGNOSIS — Z5900 Homelessness unspecified: Secondary | ICD-10-CM | POA: Diagnosis not present

## 2021-10-28 DIAGNOSIS — F1994 Other psychoactive substance use, unspecified with psychoactive substance-induced mood disorder: Secondary | ICD-10-CM | POA: Diagnosis not present

## 2021-10-28 DIAGNOSIS — F4323 Adjustment disorder with mixed anxiety and depressed mood: Secondary | ICD-10-CM | POA: Diagnosis not present
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
## 2021-10-28 NOTE — Progress Notes (Signed)
Patient attended a recovery group with RN and participated well. 

## 2021-10-28 NOTE — ED Notes (Signed)
Pt is in the bed sleeping. Respirations are even and unlabored. No acute distress noted. Will continue to monitor for safety. 

## 2021-10-28 NOTE — ED Notes (Signed)
Patient is awake and alert on unit.  Presently speaking on the phone.  No complaints, distress or withdrawal at this time.  Will monitor.

## 2021-10-29 DIAGNOSIS — F4323 Adjustment disorder with mixed anxiety and depressed mood: Secondary | ICD-10-CM | POA: Diagnosis not present

## 2021-10-29 DIAGNOSIS — Z5900 Homelessness unspecified: Secondary | ICD-10-CM | POA: Diagnosis not present

## 2021-10-29 DIAGNOSIS — F1994 Other psychoactive substance use, unspecified with psychoactive substance-induced mood disorder: Secondary | ICD-10-CM | POA: Diagnosis not present

## 2021-10-29 MED ORDER — NICOTINE 14 MG/24HR TD PT24: 14.0000 mg | MEDICATED_PATCH | Freq: Every day | TRANSDERMAL | 0 refills | Status: DC

## 2021-10-29 MED ORDER — NALTREXONE HCL 50 MG PO TABS: 50.0000 mg | ORAL_TABLET | Freq: Every day | ORAL | 0 refills | Status: DC

## 2021-10-29 MED ORDER — FLUOXETINE HCL 10 MG PO CAPS: 10.0000 mg | ORAL_CAPSULE | Freq: Every day | ORAL | 0 refills | Status: DC

## 2021-10-29 NOTE — Discharge Instructions (Signed)
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
## 2021-10-29 NOTE — ED Notes (Signed)
Patient is awake and alert on unit.  She ate breakfast and met with provider this morning.  No complaints or distress.  Denies avh shi or plan.  No evidence of withdrawal symptoms.  Makes needs known appropriately.  Will monitor and provide safe environment.

## 2021-10-29 NOTE — [facility] Group Notes (Addendum)
Problem Solving & Overcoming Obstacles  Tuesday - Problem Solving & Overcoming Obstacles   Date: 10/29/21  Type of Therapy/Therapeutic Modalities: Group, CBT, Motivational Interviewing, Solution-Focused   Participation Level: Active  Objective - In this group patients will be encouraged to explore what they see as problems/obstacles to their own wellness and recovery. Participants will watch a short motivational video clip about overcoming obstacles. They will be guided to discuss their thoughts, feelings, and behaviors related to these obstacles. The group will process together ways to cope with barriers, with attention given to specific choices patients can make. Each patient will be challenged to identify changes they are motivated to make to overcome their obstacles. This group will be process-oriented, with patients participating in exploration of their own experiences as well as giving and receiving support and challenge from other group members.  Therapeutic Goals:  Patient will identify personal and current obstacles as they relate to admission. Patient will identify barriers that currently interfere with their wellness or overcoming obstacles.  Patient will identify feelings, thought process and behaviors related to these barriers. Patient will identify two changes they are willing to make to overcome these obstacles:   Summary of Patient Progress: Patient actively participated in group on today. Patient was able to define the different ways she could change the way she views her current circumstance. Patient reports watching the video clip made her grateful for the challenges she has faced, and has encouraged her to continue to take baby steps towards her recovery and caring for herself. Patient reports she is appreciated of all the support provided by the staff here at [facility], as she truly feels supported in her journey. Patient interacted positively with LCSW and her peers. Patient was  also receptive of feedback provided by LCSW.  Lucius Conn, LCSW Clinical Social Worker [facility] Ph: [phone]

## 2021-10-29 NOTE — ED Notes (Signed)
Pt is in the bed sleeping. Respirations are even and unlabored. No acute distress noted. Will continue to monitor for safety. 
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
## 2021-10-30 DIAGNOSIS — F4323 Adjustment disorder with mixed anxiety and depressed mood: Secondary | ICD-10-CM | POA: Diagnosis not present

## 2021-10-30 DIAGNOSIS — F1994 Other psychoactive substance use, unspecified with psychoactive substance-induced mood disorder: Secondary | ICD-10-CM | POA: Diagnosis not present

## 2021-10-30 DIAGNOSIS — Z5900 Homelessness unspecified: Secondary | ICD-10-CM | POA: Diagnosis not present

## 2021-10-30 NOTE — ED Notes (Signed)
Pt is hyper verbal reports she is feeling nervous about up coming events and can't sleep. Medication for sleep and anxiey offered but was refused because she fear she would be groggy in the morning.

## 2021-10-30 NOTE — ED Notes (Signed)
Patient A&Ox4. Denies intent to harm self/others when asked. Denies A/VH. Patient denies any physical complaints when asked. No acute distress noted. Pt scheduled for admission to [facility] by 0900 this morning. Pt aware and states, "I'm excited. I'm going to get straight this time". Support and encouragement provided. Routine safety checks conducted according to facility protocol. Encouraged patient to notify staff if thoughts of harm toward self or others arise. Patient verbalize understanding and agreement. Will continue to monitor for safety.

## 2021-10-30 NOTE — ED Notes (Signed)
Pt attending AA group with good participation and after group concluded she display good interaction with peers.

## 2021-10-30 NOTE — ED Notes (Signed)
Patient A&O x 4, ambulatory. Patient discharged in no acute distress. Patient denied SI/HI, A/VH upon discharge. Patient verbalized understanding of all discharge instructions explained by staff, to include follow up appointments, RX's and safety plan. Patient reported mood 10/10.  Pt belongings returned to patient from locker #19 intact. Patient escorted to lobby via staff for transport to [facility] via [employer]. Safety maintained.

## 2021-10-30 NOTE — ED Notes (Signed)
Remains in her room and in bed at this time no distress noted pt appears asleep and movement was detected.

## 2021-11-17 ENCOUNTER — Encounter: Payer: Self-pay | Admitting: Physician Assistant

## 2021-11-17 ENCOUNTER — Ambulatory Visit: Payer: Self-pay | Admitting: Physician Assistant

## 2021-11-17 VITALS — BP 143/100 | HR 74 | Ht 65.0 in

## 2021-11-17 DIAGNOSIS — F17200 Nicotine dependence, unspecified, uncomplicated: Secondary | ICD-10-CM

## 2021-11-17 DIAGNOSIS — F1210 Cannabis abuse, uncomplicated: Secondary | ICD-10-CM

## 2021-11-17 DIAGNOSIS — Z1159 Encounter for screening for other viral diseases: Secondary | ICD-10-CM

## 2021-11-17 DIAGNOSIS — F1010 Alcohol abuse, uncomplicated: Secondary | ICD-10-CM

## 2021-11-17 DIAGNOSIS — F17210 Nicotine dependence, cigarettes, uncomplicated: Secondary | ICD-10-CM

## 2021-11-17 DIAGNOSIS — Z13220 Encounter for screening for lipoid disorders: Secondary | ICD-10-CM

## 2021-11-17 DIAGNOSIS — F411 Generalized anxiety disorder: Secondary | ICD-10-CM

## 2021-11-17 MED ORDER — NALTREXONE HCL 50 MG PO TABS: 50.0000 mg | ORAL_TABLET | Freq: Every day | ORAL | 1 refills | Status: DC

## 2021-11-17 MED ORDER — FLUOXETINE HCL 20 MG PO TABS: 20.0000 mg | ORAL_TABLET | Freq: Every day | ORAL | 1 refills | Status: DC

## 2021-11-17 NOTE — Progress Notes (Unsigned)
New Patient Office Visit  Subjective    Patient ID: Veronica Le, female    DOB: Aug 27, 1975  Age: 46 y.o. MRN: [id number]  CC:  Chief Complaint  Patient presents with   Medication Management    HPI Myrle Nxumalostates that she arrived at [facility] 10/19, is working on her long term care, but does hope to go to [facility] [place].    States her mood is optimistic, anxious and overwhelmed , taking 10 mg prozac, states that she feels that it might be a small amount of relief, but would like to consider an increase.   Sleep is better, first week her was rough, now taking benadryl or melatonin. Trazodone gives her vivid dreams.  Has not used nicotine patches   Did screen for  hiv at [facility], has not received results.   Outpatient Encounter Medications as of 11/17/2021  Medication Sig   FLUoxetine (PROZAC) 20 MG tablet Take 1 tablet (20 mg total) by mouth daily.   naltrexone (DEPADE) 50 MG tablet Take 1 tablet (50 mg total) by mouth at bedtime.   nicotine (NICODERM CQ - DOSED IN MG/24 HOURS) 14 mg/24hr patch Place 1 patch (14 mg total) onto the skin daily at 6 (six) AM.   [DISCONTINUED] FLUoxetine (PROZAC) 10 MG capsule Take 1 capsule (10 mg total) by mouth daily.   [DISCONTINUED] naltrexone (DEPADE) 50 MG tablet Take 1 tablet (50 mg total) by mouth at bedtime.   No facility-administered encounter medications on file as of 11/17/2021.    Past Medical History:  Diagnosis Date   Medical history non-contributory    Panic attack     Past Surgical History:  Procedure Laterality Date   NO PAST SURGERIES     ORIF ANKLE FRACTURE Left 11/01/2019   Procedure: OPEN REDUCTION INTERNAL FIXATION (ORIF) LEFT ANKLE FRACTURE;  Surgeon: Meredith Pel, MD;  Location: [place];  Service: Orthopedics;  Laterality: Left;    History reviewed. No pertinent family history.  Social History   Socioeconomic History   Marital status: Divorced    Spouse name: Not on file   Number  of children: Not on file   Years of education: Not on file   Highest education level: Not on file  Occupational History   Not on file  Tobacco Use   Smoking status: Some Days    Types: Cigarettes   Smokeless tobacco: Never  Vaping Use   Vaping Use: Never used  Substance and Sexual Activity   Alcohol use: Yes   Drug use: Yes    Types: Marijuana   Sexual activity: Yes    Birth control/protection: None  Other Topics Concern   Not on file  Social History Narrative   Not on file   Social Determinants of Health   Financial Resource Strain: Not on file  Food Insecurity: Not on file  Transportation Needs: Not on file  Physical Activity: Not on file  Stress: Not on file  Social Connections: Not on file  Intimate Partner Violence: Not on file    Review of Systems  Constitutional: Negative.   HENT: Negative.    Eyes: Negative.   Respiratory:  Negative for shortness of breath.   Cardiovascular:  Negative for chest pain.  Gastrointestinal: Negative.   Genitourinary: Negative.   Musculoskeletal: Negative.   Skin: Negative.   Neurological: Negative.   Endo/Heme/Allergies: Negative.   Psychiatric/Behavioral:  Positive for depression. The patient is nervous/anxious and has insomnia.  Objective    BP (!) 143/100 (BP Location: Right Arm, Patient Position: Sitting, Cuff Size: Normal)   Pulse 74   Ht 5\' 5"  (1.651 m)   LMP 10/24/2021   BMI 30.14 kg/m   Physical Exam Vitals and nursing note reviewed.  Constitutional:      Appearance: Normal appearance.  HENT:     Head: Normocephalic and atraumatic.     Right Ear: External ear normal.     Left Ear: External ear normal.     Nose: Nose normal.     Mouth/Throat:     Mouth: Mucous membranes are moist.     Pharynx: Oropharynx is clear.  Eyes:     Extraocular Movements: Extraocular movements intact.     Conjunctiva/sclera: Conjunctivae normal.     Pupils: Pupils are equal, round, and reactive to light.   Cardiovascular:     Rate and Rhythm: Normal rate and regular rhythm.     Pulses: Normal pulses.     Heart sounds: Normal heart sounds.  Pulmonary:     Effort: Pulmonary effort is normal.     Breath sounds: Normal breath sounds.  Musculoskeletal:        General: Normal range of motion.     Cervical back: Normal range of motion and neck supple.  Skin:    General: Skin is warm and dry.  Neurological:     General: No focal deficit present.     Mental Status: She is alert and oriented to person, place, and time.  Psychiatric:        Attention and Perception: Attention normal.        Mood and Affect: Mood is anxious. Affect is tearful.        Speech: Speech normal.        Behavior: Behavior normal.        Thought Content: Thought content normal. Thought content does not include homicidal or suicidal ideation.        Cognition and Memory: Cognition and memory normal.        Judgment: Judgment normal.        Assessment & Plan:   Problem List Items Addressed This Visit       Other   GAD (generalized anxiety disorder) - Primary   Relevant Medications   FLUoxetine (PROZAC) 20 MG tablet   Other Relevant Orders   Comprehensive metabolic panel   Vitamin D, 25-hydroxy   Alcohol abuse   Relevant Medications   naltrexone (DEPADE) 50 MG tablet   Marijuana abuse   Tobacco use disorder   Other Visit Diagnoses     Encounter for HCV screening test for low risk patient       Relevant Orders   HCV Ab w Reflex to Quant PCR   Screening, lipid       Relevant Orders   Lipid panel     1. GAD (generalized anxiety disorder) Increase Prozac 20 mg.  Patient to follow-up with mobile unit in 2 weeks.  Patient education given on supportive care.  Red flags given for prompt reevaluation  Patient given [facility]   - FLUoxetine (PROZAC) 20 MG tablet; Take 1 tablet (20 mg total) by mouth daily.  Dispense: 30 tablet;  Refill: 1 - Comprehensive metabolic panel; Future - Vitamin D, 25-hydroxy; Future  2. Alcohol abuse Continue current regimen, currently in substance abuse treatment program - naltrexone (DEPADE) 50 MG tablet; Take 1 tablet (50 mg total)  by mouth at bedtime.  Dispense: 30 tablet; Refill: 1  3. Marijuana abuse   4. Tobacco use disorder   5. Encounter for HCV screening test for low risk patient  - HCV Ab w Reflex to Quant PCR; Future  6. Screening, lipid  - Lipid panel; Future   I have reviewed the patient's medical history (PMH, PSH, Social History, Family History, Medications, and allergies) , and have been updated if relevant. I spent 40 minutes reviewing chart and  face to face time with patient.  No AVS was created, patient does not have access to MyChart at this time.  No printer available on screening van.  Patient education given with teach back method.   Return in about 2 weeks (around 12/01/2021) for with MMU.   Loraine Grip Mayers, PA-C

## 2021-11-20 ENCOUNTER — Ambulatory Visit: Payer: Self-pay | Attending: Family Medicine

## 2021-11-20 DIAGNOSIS — F411 Generalized anxiety disorder: Secondary | ICD-10-CM

## 2021-11-20 DIAGNOSIS — Z13220 Encounter for screening for lipoid disorders: Secondary | ICD-10-CM

## 2021-11-20 DIAGNOSIS — Z1159 Encounter for screening for other viral diseases: Secondary | ICD-10-CM

## 2021-11-21 LAB — COMPREHENSIVE METABOLIC PANEL
ALT: 10 IU/L (ref 0–32)
AST: 16 IU/L (ref 0–40)
Albumin/Globulin Ratio: 1.7 (ref 1.2–2.2)
Albumin: 4.2 g/dL (ref 3.9–4.9)
Alkaline Phosphatase: 83 IU/L (ref 44–121)
BUN/Creatinine Ratio: 14 (ref 9–23)
BUN: 9 mg/dL (ref 6–24)
Bilirubin Total: 0.3 mg/dL (ref 0.0–1.2)
CO2: 21 mmol/L (ref 20–29)
Calcium: 9.5 mg/dL (ref 8.7–10.2)
Chloride: 103 mmol/L (ref 96–106)
Creatinine, Ser: 0.66 mg/dL (ref 0.57–1.00)
Globulin, Total: 2.5 g/dL (ref 1.5–4.5)
Glucose: 76 mg/dL (ref 70–99)
Potassium: 4.7 mmol/L (ref 3.5–5.2)
Sodium: 137 mmol/L (ref 134–144)
Total Protein: 6.7 g/dL (ref 6.0–8.5)
eGFR: 110 mL/min/{1.73_m2} (ref >59)

## 2021-11-21 LAB — LIPID PANEL
Chol/HDL Ratio: 2.8 ratio (ref 0.0–4.4)
Cholesterol, Total: 166 mg/dL (ref 100–199)
HDL: 60 mg/dL (ref >39)
LDL Chol Calc (NIH): 91 mg/dL (ref 0–99)
Triglycerides: 78 mg/dL (ref 0–149)
VLDL Cholesterol Cal: 15 mg/dL (ref 5–40)

## 2021-11-21 LAB — VITAMIN D 25 HYDROXY (VIT D DEFICIENCY, FRACTURES): Vit D, 25-Hydroxy: 22.9 ng/mL — ABNORMAL LOW (ref 30.0–100.0)

## 2021-11-21 LAB — HCV INTERPRETATION

## 2021-11-21 LAB — HCV AB W REFLEX TO QUANT PCR: HCV Ab: NONREACTIVE

## 2021-12-19 ENCOUNTER — Telehealth: Payer: Self-pay

## 2021-12-19 NOTE — Telephone Encounter (Signed)
Telephoned patient at mobile number and left message with [facility] (scholarship) contact information.

## 2022-05-10 ENCOUNTER — Encounter (HOSPITAL_COMMUNITY): Payer: Self-pay

## 2022-05-10 ENCOUNTER — Other Ambulatory Visit: Payer: Self-pay

## 2022-05-10 ENCOUNTER — Emergency Department (HOSPITAL_COMMUNITY)
Admission: EM | Admit: 2022-05-10 | Discharge: 2022-05-11 | Disposition: A | Discharge status: Home / Self Care | Payer: Self-pay | Attending: Emergency Medicine | Admitting: Emergency Medicine

## 2022-05-10 DIAGNOSIS — F1020 Alcohol dependence, uncomplicated: Secondary | ICD-10-CM | POA: Diagnosis present

## 2022-05-10 DIAGNOSIS — E876 Hypokalemia: Secondary | ICD-10-CM | POA: Insufficient documentation

## 2022-05-10 DIAGNOSIS — R45851 Suicidal ideations: Secondary | ICD-10-CM | POA: Insufficient documentation

## 2022-05-10 DIAGNOSIS — F1010 Alcohol abuse, uncomplicated: Secondary | ICD-10-CM | POA: Insufficient documentation

## 2022-05-10 DIAGNOSIS — Y908 Blood alcohol level of 240 mg/100 ml or more: Secondary | ICD-10-CM | POA: Insufficient documentation

## 2022-05-10 DIAGNOSIS — F32A Depression, unspecified: Secondary | ICD-10-CM | POA: Insufficient documentation

## 2022-05-10 HISTORY — DX: Alcohol abuse, uncomplicated: F10.10

## 2022-05-10 HISTORY — DX: Adjustment disorder, unspecified: F43.20

## 2022-05-10 HISTORY — DX: Other psychoactive substance use, unspecified with psychoactive substance-induced mood disorder: F19.94

## 2022-05-10 LAB — ACETAMINOPHEN LEVEL: Acetaminophen (Tylenol), Serum: <10 ug/mL — ABNORMAL LOW (ref 10–30)

## 2022-05-10 LAB — COMPREHENSIVE METABOLIC PANEL
ALT: 21 U/L (ref 0–44)
AST: 38 U/L (ref 15–41)
Albumin: 3.9 g/dL (ref 3.5–5.0)
Alkaline Phosphatase: 75 U/L (ref 38–126)
Anion gap: 14 (ref 5–15)
BUN: 14 mg/dL (ref 6–20)
CO2: 18 mmol/L — ABNORMAL LOW (ref 22–32)
Calcium: 8.4 mg/dL — ABNORMAL LOW (ref 8.9–10.3)
Chloride: 106 mmol/L (ref 98–111)
Creatinine, Ser: 0.77 mg/dL (ref 0.44–1.00)
GFR, Estimated: >60 mL/min (ref >60)
Glucose, Bld: 119 mg/dL — ABNORMAL HIGH (ref 70–99)
Potassium: 3.4 mmol/L — ABNORMAL LOW (ref 3.5–5.1)
Sodium: 138 mmol/L (ref 135–145)
Total Bilirubin: 0.7 mg/dL (ref 0.3–1.2)
Total Protein: 7.6 g/dL (ref 6.5–8.1)

## 2022-05-10 LAB — CBC WITH DIFFERENTIAL/PLATELET
Abs Immature Granulocytes: 0.02 10*3/uL (ref 0.00–0.07)
Basophils Absolute: 0 10*3/uL (ref 0.0–0.1)
Basophils Relative: 1 %
Eosinophils Absolute: 0 10*3/uL (ref 0.0–0.5)
Eosinophils Relative: 0 %
HCT: 35.7 % — ABNORMAL LOW (ref 36.0–46.0)
Hemoglobin: 12.2 g/dL (ref 12.0–15.0)
Immature Granulocytes: 0 %
Lymphocytes Relative: 32 %
Lymphs Abs: 2.2 10*3/uL (ref 0.7–4.0)
MCH: 34.8 pg — ABNORMAL HIGH (ref 26.0–34.0)
MCHC: 34.2 g/dL (ref 30.0–36.0)
MCV: 101.7 fL — ABNORMAL HIGH (ref 80.0–100.0)
Monocytes Absolute: 0.3 10*3/uL (ref 0.1–1.0)
Monocytes Relative: 5 %
Neutro Abs: 4.1 10*3/uL (ref 1.7–7.7)
Neutrophils Relative %: 62 %
Platelets: 260 10*3/uL (ref 150–400)
RBC: 3.51 MIL/uL — ABNORMAL LOW (ref 3.87–5.11)
RDW: 14.4 % (ref 11.5–15.5)
WBC: 6.7 10*3/uL (ref 4.0–10.5)
nRBC: 0 % (ref 0.0–0.2)

## 2022-05-10 LAB — RAPID URINE DRUG SCREEN, HOSP PERFORMED
Amphetamines: NOT DETECTED
Barbiturates: NOT DETECTED
Benzodiazepines: NOT DETECTED
Cocaine: NOT DETECTED
Opiates: NOT DETECTED
Tetrahydrocannabinol: NOT DETECTED

## 2022-05-10 LAB — SALICYLATE LEVEL: Salicylate Lvl: <7 mg/dL — ABNORMAL LOW (ref 7.0–30.0)

## 2022-05-10 LAB — ETHANOL: Alcohol, Ethyl (B): 260 mg/dL — ABNORMAL HIGH (ref <10)

## 2022-05-10 LAB — PREGNANCY, URINE: Preg Test, Ur: NEGATIVE

## 2022-05-10 MED ORDER — THIAMINE MONONITRATE 100 MG PO TABS
100.0000 mg | ORAL_TABLET | Freq: Every day | ORAL | Status: DC
  Administered 2022-05-10 – 2022-05-11 (×2): 100 mg via ORAL
  Filled 2022-05-10 (×2): qty 1

## 2022-05-10 MED ORDER — LORAZEPAM 1 MG PO TABS: 0.0000 mg | ORAL_TABLET | Freq: Two times a day (BID) | ORAL | Status: DC

## 2022-05-10 MED ORDER — POTASSIUM CHLORIDE CRYS ER 20 MEQ PO TBCR: 20.0000 meq | EXTENDED_RELEASE_TABLET | Freq: Two times a day (BID) | ORAL | Status: DC

## 2022-05-10 MED ORDER — ONDANSETRON HCL 4 MG PO TABS: 4.0000 mg | ORAL_TABLET | Freq: Three times a day (TID) | ORAL | Status: DC | PRN

## 2022-05-10 MED ORDER — LORAZEPAM 1 MG PO TABS: 0.0000 mg | ORAL_TABLET | Freq: Four times a day (QID) | ORAL | Status: DC

## 2022-05-10 MED ORDER — POTASSIUM CHLORIDE CRYS ER 20 MEQ PO TBCR
20.0000 meq | EXTENDED_RELEASE_TABLET | Freq: Once | ORAL | Status: AC
  Administered 2022-05-10: 20 meq via ORAL
  Filled 2022-05-10: qty 1

## 2022-05-10 MED ORDER — NICOTINE 21 MG/24HR TD PT24
21.0000 mg | MEDICATED_PATCH | Freq: Every day | TRANSDERMAL | Status: DC
  Filled 2022-05-10: qty 1

## 2022-05-10 MED ORDER — LORAZEPAM 2 MG/ML IJ SOLN: 0.0000 mg | Freq: Four times a day (QID) | INTRAMUSCULAR | Status: DC

## 2022-05-10 MED ORDER — HALOPERIDOL LACTATE 5 MG/ML IJ SOLN
5.0000 mg | Freq: Once | INTRAMUSCULAR | Status: AC | PRN
  Administered 2022-05-10: 5 mg via INTRAMUSCULAR
  Filled 2022-05-10: qty 1

## 2022-05-10 MED ORDER — LORAZEPAM 2 MG/ML IJ SOLN: 0.0000 mg | Freq: Two times a day (BID) | INTRAMUSCULAR | Status: DC

## 2022-05-10 MED ORDER — ALUM & MAG HYDROXIDE-SIMETH 200-200-20 MG/5ML PO SUSP: 30.0000 mL | Freq: Four times a day (QID) | ORAL | Status: DC | PRN

## 2022-05-10 MED ORDER — IBUPROFEN 200 MG PO TABS: 600.0000 mg | ORAL_TABLET | Freq: Three times a day (TID) | ORAL | Status: DC | PRN

## 2022-05-10 MED ORDER — HALOPERIDOL LACTATE 5 MG/ML IJ SOLN
INTRAMUSCULAR | Status: AC
  Filled 2022-05-10: qty 1

## 2022-05-10 MED ORDER — THIAMINE HCL 100 MG/ML IJ SOLN
100.0000 mg | Freq: Every day | INTRAMUSCULAR | Status: DC
  Filled 2022-05-10: qty 2

## 2022-05-10 NOTE — ED Notes (Signed)
Patient is lying in bed, eyes closed, no acute distress noted

## 2022-05-10 NOTE — ED Notes (Addendum)
Patient is sitting on her bed, talking very loudly, stating she is "sick of being alive", she states she does not want to have the guilt of killing herself but she wants to be a dog or a cat, not a human, because she hates humans.  Patient given a dinner tray

## 2022-05-10 NOTE — Consult Note (Signed)
Patient just received Haldol Injection before providers arrival.  Patient nodding off at this time and Nursing staff trying to draw blood.  RN says to come back later for assessment.

## 2022-05-10 NOTE — ED Notes (Signed)
Pt. Belongings is in the cabinet label pt.belongings (19-22) she has one bag

## 2022-05-10 NOTE — ED Notes (Signed)
Patient agreed to change into purple scrubs.  Patient was wanded by security, patient has one belongings bag labeled and secured across from room 18.  Patient is alternating between being pleasant and chatty and being defensive and verbally aggressive

## 2022-05-10 NOTE — ED Notes (Signed)
Patient's sister would like to receive updates as allowed, her name is Zitty and her phone number is [phone]
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
## 2022-05-10 NOTE — ED Triage Notes (Signed)
Patient arrived via [facility] after they were called by a friend because patient was having an anxiety attack, after EMS started talking to her, patient expressed SI.  Patient is agitated on arrival.  Patient states she is "tired" of everything and she wants to me "mist" not missed, she denies wanting to hurt herself because she does not want to hurt her kids, her ex-husband, or the other people who care about her.

## 2022-05-10 NOTE — ED Provider Notes (Signed)
[place] EMERGENCY DEPARTMENT AT [facility] Provider Note   CSN: [id number] Arrival date & time: 05/10/22  1605     History  Chief Complaint  Patient presents with   Suicidal   Anxiety    Veronica Le is a 47 y.o. female.  HPI 47 year old female with a history of alcohol abuse presents with feeling suicidal.  She states she wishes she was not alive.  However she states she does not want to kill herself due to the kids she has and family she has. She repeatedly tells me that she wants to be "M-I-ST" rather than "M-I-S-S-E-D."  She states she wants to evaporating be gone but does not want to kill herself.  She states that she has felt this way for a long time but it acutely got worse today.  She states she talked to her ex-husband today, but does not think that is what triggered this.  She chronically drinks alcohol every day and also occasionally uses marijuana.  When asked about other drugs she states she did use cocaine 2 weeks ago. No medical complaints.   Home Medications Prior to Admission medications   Medication Sig Start Date End Date Taking? Authorizing Provider  FLUoxetine (PROZAC) 20 MG tablet Take 1 tablet (20 mg total) by mouth daily. 11/17/21   Mayers, Cari S, PA-C  naltrexone (DEPADE) 50 MG tablet Take 1 tablet (50 mg total) by mouth at bedtime. 11/17/21   Mayers, Cari S, PA-C  nicotine (NICODERM CQ - DOSED IN MG/24 HOURS) 14 mg/24hr patch Place 1 patch (14 mg total) onto the skin daily at 6 (six) AM. 10/30/21   Carlyn Reichert, MD      Allergies    Patient has no known allergies.    Review of Systems   Review of Systems  Constitutional:  Negative for fever.  Respiratory:  Negative for cough and shortness of breath.   Cardiovascular:  Negative for chest pain.  Gastrointestinal:  Negative for abdominal pain.  Psychiatric/Behavioral:  Positive for dysphoric mood and suicidal ideas.     Physical Exam Updated Vital Signs BP (!) 134/99 (BP Location:  Left Arm)   Pulse 98   Temp 98.8 F (37.1 C) (Oral)   Resp 19   Ht 5\' 5"  (1.651 m)   Wt 90.7 kg   SpO2 100%   BMI 33.28 kg/m  Physical Exam Vitals and nursing note reviewed.  Constitutional:      Appearance: She is well-developed.     Comments: intoxicated  HENT:     Head: Normocephalic and atraumatic.  Cardiovascular:     Rate and Rhythm: Normal rate and regular rhythm.     Heart sounds: Normal heart sounds.  Pulmonary:     Effort: Pulmonary effort is normal.  Abdominal:     General: There is no distension.  Skin:    General: Skin is warm and dry.  Neurological:     Mental Status: She is alert.  Psychiatric:        Mood and Affect: Mood is depressed.        Thought Content: Thought content does not include suicidal plan.     Comments: Tearful, depressed     ED Results / Procedures / Treatments   Labs (all labs ordered are listed, but only abnormal results are displayed) Labs Reviewed  COMPREHENSIVE METABOLIC PANEL - Abnormal; Notable for the following components:      Result Value   Potassium 3.4 (*)    CO2 18 (*)  Glucose, Bld 119 (*)    Calcium 8.4 (*)    All other components within normal limits  ETHANOL - Abnormal; Notable for the following components:   Alcohol, Ethyl (B) 260 (*)    All other components within normal limits  ACETAMINOPHEN LEVEL - Abnormal; Notable for the following components:   Acetaminophen (Tylenol), Serum <10 (*)    All other components within normal limits  SALICYLATE LEVEL - Abnormal; Notable for the following components:   Salicylate Lvl <7.0 (*)    All other components within normal limits  CBC WITH DIFFERENTIAL/PLATELET - Abnormal; Notable for the following components:   RBC 3.51 (*)    HCT 35.7 (*)    MCV 101.7 (*)    MCH 34.8 (*)    All other components within normal limits  RAPID URINE DRUG SCREEN, HOSP PERFORMED  PREGNANCY, URINE    EKG None  Radiology No results found.  Procedures Procedures     Medications Ordered in ED Medications  potassium chloride SA (KLOR-CON M) CR tablet 20 mEq (has no administration in time range)  LORazepam (ATIVAN) injection 0-4 mg (has no administration in time range)    Or  LORazepam (ATIVAN) tablet 0-4 mg (has no administration in time range)  LORazepam (ATIVAN) injection 0-4 mg (has no administration in time range)    Or  LORazepam (ATIVAN) tablet 0-4 mg (has no administration in time range)  thiamine (VITAMIN B1) tablet 100 mg (has no administration in time range)    Or  thiamine (VITAMIN B1) injection 100 mg (has no administration in time range)  nicotine (NICODERM CQ - dosed in mg/24 hours) patch 21 mg (has no administration in time range)  alum & mag hydroxide-simeth (MAALOX/MYLANTA) 200-200-20 MG/5ML suspension 30 mL (has no administration in time range)  ondansetron (ZOFRAN) tablet 4 mg (has no administration in time range)  ibuprofen (ADVIL) tablet 600 mg (has no administration in time range)  haloperidol lactate (HALDOL) injection 5 mg (5 mg Intramuscular Given 05/10/22 1814)    ED Course/ Medical Decision Making/ A&P Clinical Course as of 05/10/22 1936  Sun May 10, 2022  1806 Patient is having escalating agitation.  At times we can calm her down but then she will get agitated again.  Given this, will give IM Haldol as she appears intoxicated and is having a psychiatric illness. [SG]    Clinical Course User Index [SG] Pricilla Loveless, MD                             Medical Decision Making Amount and/or Complexity of Data Reviewed Labs: ordered.    Details: Alcohol elevated, consistent with prior values.  Mild hypokalemia.  Low bicarbonate but no acidosis.  Risk OTC drugs. Prescription drug management.   Patient presents with suicidal thoughts.  She is also intoxicated.  She started getting agitated as above and had to be given IM Haldol which has calmed her down.  Labs are overall reassuring besides some mild hypokalemia.   This will be repleted.  Otherwise she appears medically stable for psychiatric consultation and disposition.        Final Clinical Impression(s) / ED Diagnoses Final diagnoses:  Alcohol abuse    Rx / DC Orders ED Discharge Orders     None         Pricilla Loveless, MD 05/10/22 [phone]

## 2022-05-10 NOTE — ED Notes (Signed)
Patient's sister at the bedside.  Patient is lying in bed with her head covered by a blanket

## 2022-05-11 ENCOUNTER — Encounter (HOSPITAL_COMMUNITY): Payer: Self-pay

## 2022-05-11 DIAGNOSIS — F1020 Alcohol dependence, uncomplicated: Secondary | ICD-10-CM | POA: Diagnosis present

## 2022-05-11 NOTE — ED Notes (Signed)
Assumed care of pt. Pt ambulated to unit without incident. Pt is CAOx4. Pt denies pain at this time. Pt ate breakfast before coming to TCU. Pt is cooperative and asked to watch TV. I found show on TV that pt wanted and explained the call light system to her. Pt was left in room watching TV.

## 2022-05-11 NOTE — ED Notes (Signed)
Pt is meeting with psychiatrist, will return to evaluate [facility] for med administration

## 2022-05-11 NOTE — Discharge Summary (Signed)
[facility] ED Discharge  05/11/2022 10:53 AM Veronica Le  MRN:  [id number]  Principal Problem: Alcohol use disorder, moderate, dependence (HCC) Discharge Diagnoses: Principal Problem:   Alcohol use disorder, moderate, dependence (HCC)  Clinical Impression:  Final diagnoses:  Alcohol abuse   Subjective: AA Female, 47 years old with hx of Alcohol abuse was brought in to the ER by EMS after a friend called stating that patient was having panic attack.  As EMS was talking to her she voiced suicide ideation.  Per Triage note patient became agitated on arrival to the ER and stated that she is tired of life and if anything happens to her she will not be missed.  She also added that she did not want to hurt herself because she did not want to hurt her kids by doing anything to herself.  Alcohol level on arrival to the ER WAS 260, UDS was negative This morning patient was seen awake, alert and calm but tearful.  She reports that she is disappointed that she relapsed on Alcohol earlier this year after the efforts she made to stop drinking Alcohol.  She reports marital .issues, missing her three children who have been with their Biological father.  She also blames her drinking and behavior due to not taking her Prozac since she did not have refills.  Patient states she does well when she is on her Prozac.  Patient was detoxed at [facility], spent 30 days at [facility] and then [facility]  After making all these efforts she relapsed.  She admits to using Cannabis daily but plans to stop doing so now that she is in talk with her ex husband to come back home.  Patient is currently living  with a friend but plans to go back to her ex husband soon with the kid.  Patient was tearful the entire interaction stating she misses her children.  She does not believe she need another detox treatment or residential treatment.  She want to engage in outpatient Psychiatry care and outpatient Alcohol  treatment.   Patient is fully awake and alert and engaged in meaningful conversation.  She denied SI/HI/AVH and no mention of paranoia.  We discussed safety plan-call 911 or [phone] or go to [facility] health facility for mental health crisis not limited to suicide ideation or thought.  Patient is advised to engage in AA/NA meetings and to take her Prozac as prescribed.  Patient is Psychiatrically cleared.  ED Assessment Time Calculation: Start Time: 1021 Stop Time: 1043 Total Time in Minutes (Assessment Completion): 22   Past Psychiatric History: Alcohol use Disorder, Depression and anxiety.  Multiple ER Visits, Multiple [facility] visits and care.  Past Medical History:  Past Medical History:  Diagnosis Date   Adjustment disorder    Alcohol abuse    Medical history non-contributory    Panic attack    Substance induced mood disorder (HCC)     Past Surgical History:  Procedure Laterality Date   NO PAST SURGERIES     ORIF ANKLE FRACTURE Left 11/01/2019   Procedure: OPEN REDUCTION INTERNAL FIXATION (ORIF) LEFT ANKLE FRACTURE;  Surgeon: Cammy Copa, MD;  Location: [facility] OR;  Service: Orthopedics;  Laterality: Left;   Family History: History reviewed. No pertinent family history. Family Psychiatric  History: Denies Social History:  Social History   Substance and Sexual Activity  Alcohol Use Yes     Social History   Substance and Sexual Activity  Drug Use Yes  Types: Marijuana    Social History   Socioeconomic History   Marital status: Divorced    Spouse name: Not on file   Number of children: Not on file   Years of education: Not on file   Highest education level: Not on file  Occupational History   Not on file  Tobacco Use   Smoking status: Some Days    Types: Cigarettes   Smokeless tobacco: Never  Vaping Use   Vaping Use: Never used  Substance and Sexual Activity   Alcohol use: Yes   Drug use: Yes    Types: Marijuana   Sexual activity: Yes    Birth  control/protection: None  Other Topics Concern   Not on file  Social History Narrative   Not on file   Social Determinants of Health   Financial Resource Strain: Not on file  Food Insecurity: Not on file  Transportation Needs: Not on file  Physical Activity: Not on file  Stress: Not on file  Social Connections: Not on file    Tobacco Cessation:  N/A, patient does not currently use tobacco products  Current Medications: Current Facility-Administered Medications  Medication Dose Route Frequency Provider Last Rate Last Admin   alum & mag hydroxide-simeth (MAALOX/MYLANTA) 200-200-20 MG/5ML suspension 30 mL  30 mL Oral Q6H PRN Pricilla Loveless, MD       ibuprofen (ADVIL) tablet 600 mg  600 mg Oral Q8H PRN Pricilla Loveless, MD       LORazepam (ATIVAN) injection 0-4 mg  0-4 mg Intravenous Q6H Pricilla Loveless, MD       Or   LORazepam (ATIVAN) tablet 0-4 mg  0-4 mg Oral Q6H Pricilla Loveless, MD       [START ON 05/13/2022] LORazepam (ATIVAN) injection 0-4 mg  0-4 mg Intravenous Q12H Pricilla Loveless, MD       Or   Melene Muller ON 05/13/2022] LORazepam (ATIVAN) tablet 0-4 mg  0-4 mg Oral Q12H Pricilla Loveless, MD       nicotine (NICODERM CQ - dosed in mg/24 hours) patch 21 mg  21 mg Transdermal Daily Pricilla Loveless, MD       ondansetron (ZOFRAN) tablet 4 mg  4 mg Oral Q8H PRN Pricilla Loveless, MD       thiamine (VITAMIN B1) tablet 100 mg  100 mg Oral Daily Pricilla Loveless, MD   100 mg at 05/11/22 1022   Or   thiamine (VITAMIN B1) injection 100 mg  100 mg Intravenous Daily Pricilla Loveless, MD       Current Outpatient Medications  Medication Sig Dispense Refill   FLUoxetine (PROZAC) 20 MG tablet Take 1 tablet (20 mg total) by mouth daily. 30 tablet 1   naltrexone (DEPADE) 50 MG tablet Take 1 tablet (50 mg total) by mouth at bedtime. 30 tablet 1   nicotine (NICODERM CQ - DOSED IN MG/24 HOURS) 14 mg/24hr patch Place 1 patch (14 mg total) onto the skin daily at 6 (six) AM. 28 patch 0   PTA  Medications: (Not in a hospital admission)   [place] Scale:  Flowsheet Row ED from 05/10/2022 in [facility] Emergency Department at [facility] ED from 10/25/2021 in [facility] ED from 10/24/2021 in [facility] Emergency Department at [facility]  C-SSRS RISK CATEGORY High Risk No Risk Moderate Risk       Musculoskeletal: Strength & Muscle Tone: within normal limits Gait & Station: normal Patient leans: Front  Psychiatric Specialty Exam: Presentation  General Appearance:  Neat  Eye Contact: Minimal  Speech: Clear and Coherent; Normal Rate  Speech Volume: Normal  Handedness: Right   Mood and Affect  Mood: Anxious  Affect: Congruent   Thought Process  Thought Processes: Coherent; Goal Directed; Linear  Descriptions of Associations:Intact  Orientation:Full (Time, Place and Person)  Thought Content:Logical  History of Schizophrenia/Schizoaffective disorder:No  Duration of Psychotic Symptoms:No data recorded Hallucinations:Hallucinations: None  Ideas of Reference:None  Suicidal Thoughts:Suicidal Thoughts: No  Homicidal Thoughts:Homicidal Thoughts: No   Sensorium  Memory: Immediate Good; Recent Good; Remote Good  Judgment: Intact  Insight: Present   Executive Functions  Concentration: Fair  Attention Span: Fair  Recall: [employer] of Knowledge: Fair  Language: Fair   Psychomotor Activity  Psychomotor Activity: Psychomotor Activity: Normal   Assets  Assets: Communication Skills; Desire for Improvement; Housing   Sleep  Sleep: Sleep: Good    Physical Exam: Physical Exam Vitals and nursing note reviewed.  Constitutional:      Appearance: Normal appearance.  HENT:     Head: Normocephalic and atraumatic.     Nose: Nose normal.  Cardiovascular:     Rate and Rhythm: Normal rate and regular rhythm.  Pulmonary:     Effort: Pulmonary effort is normal.   Musculoskeletal:        General: Normal range of motion.     Cervical back: Normal range of motion.  Skin:    General: Skin is warm and dry.  Neurological:     Mental Status: She is alert.  Psychiatric:        Attention and Perception: Attention and perception normal.        Mood and Affect: Mood and affect normal.        Speech: Speech normal.        Behavior: Behavior normal. Behavior is cooperative.        Thought Content: Thought content normal.        Cognition and Memory: Cognition and memory normal.        Judgment: Judgment normal.    Review of Systems  Constitutional: Negative.   HENT: Negative.    Eyes: Negative.   Respiratory: Negative.    Cardiovascular: Negative.   Gastrointestinal: Negative.   Genitourinary: Negative.   Musculoskeletal: Negative.   Skin: Negative.   Neurological: Negative.   Endo/Heme/Allergies: Negative.   Psychiatric/Behavioral:  Positive for depression. The patient is nervous/anxious.    Blood pressure (!) 124/91, pulse 94, temperature 97.9 F (36.6 C), temperature source Oral, resp. rate 16, height 5\' 5"  (1.651 m), weight 90.7 kg, SpO2 100 %, unknown if currently breastfeeding. Body mass index is 33.28 kg/m.   Demographic Factors:  Adolescent or young adult, Low socioeconomic status, and Unemployed  Loss Factors: Financial problems/change in socioeconomic status  Historical Factors: NA  Risk Reduction Factors:   Responsible for children under 34 years of age, Sense of responsibility to family, Religious beliefs about death, Living with another person, especially a relative, and Positive therapeutic relationship  Continued Clinical Symptoms:  Depression:   Comorbid alcohol abuse/dependence Alcohol/Substance Abuse/Dependencies More than one psychiatric diagnosis  Cognitive Features That Contribute To Risk:  None    Suicide Risk:  Minimal: No identifiable suicidal ideation.  Patients presenting with no risk factors but with  morbid ruminations; may be classified as minimal risk based on the severity of the depressive symptoms    Plan Of Care/Follow-up recommendations:  Activity:  as tolerated Diet:  Regular  Medical Decision Making: Patient is sober, alert and  oriented x 5.  She is interested in going back to outpatient Alcohol treatment.  She also want to continue taking her Prozac for depression and anxiety as prescribed.   Patient denied SI/HI/AVH.  Patient is Psychiatrically cleared.  Problem 1: Alcohol use disorder with intoxication  Disposition: Psychiatrically cleared.  Earney Navy, NP-PMHNP-BC 05/11/2022, 10:53 AM

## 2022-05-11 NOTE — ED Provider Notes (Signed)
Emergency Medicine Observation Re-evaluation Note  Veronica Le is a 47 y.o. female, seen on rounds today.  Pt initially presented to the ED for complaints of Suicidal and Anxiety Currently, the patient is sleeping.  Physical Exam  BP (!) 146/76   Pulse 78   Temp 97.9 F (36.6 C) (Oral)   Resp 16   Ht 5\' 5"  (1.651 m)   Wt 90.7 kg   SpO2 100%   BMI 33.28 kg/m  Physical Exam General: NAD Cardiac: RR Lungs: even, unlabored Psych: --later eval, denies SI/HI  ED Course / MDM  EKG:   I have reviewed the labs performed to date as well as medications administered while in observation.  Recent changes in the last 24 hours include, medically cleared.  Plan  Current plan is for psychiatry to evaluate for SI.  She is adamant she was not describing SI.  Psychiatry evaluated and feel she is appropriate for outpatient care.  She contracts for safety.  Given resources. Patient discharged in stable condition with understanding of reasons to return.     Alvira Monday, MD 05/13/22 1116

## 2022-05-11 NOTE — ED Notes (Signed)
Pt made a phone call to her mother to come pick her up for discharge

## 2022-05-11 NOTE — ED Notes (Signed)
Patients 1 belongings bag moved to locker 36 in [place].

## 2022-05-11 NOTE — ED Notes (Signed)
**  Pt still needs TTS consult

## 2022-09-26 ENCOUNTER — Other Ambulatory Visit: Payer: Self-pay

## 2022-09-26 ENCOUNTER — Encounter (HOSPITAL_COMMUNITY): Payer: Self-pay

## 2022-09-26 ENCOUNTER — Emergency Department (HOSPITAL_COMMUNITY)
Admission: EM | Admit: 2022-09-26 | Discharge: 2022-09-26 | Disposition: A | Discharge status: Home / Self Care | Payer: Self-pay | Attending: Emergency Medicine | Admitting: Emergency Medicine

## 2022-09-26 DIAGNOSIS — K029 Dental caries, unspecified: Secondary | ICD-10-CM | POA: Insufficient documentation

## 2022-09-26 DIAGNOSIS — K0889 Other specified disorders of teeth and supporting structures: Secondary | ICD-10-CM

## 2022-09-26 DIAGNOSIS — K047 Periapical abscess without sinus: Secondary | ICD-10-CM | POA: Insufficient documentation

## 2022-09-26 MED ORDER — AMOXICILLIN 500 MG PO CAPS
500.0000 mg | ORAL_CAPSULE | Freq: Once | ORAL | Status: AC
  Administered 2022-09-26: 500 mg via ORAL
  Filled 2022-09-26: qty 1

## 2022-09-26 MED ORDER — AMOXICILLIN 500 MG PO CAPS: 500.0000 mg | ORAL_CAPSULE | Freq: Three times a day (TID) | ORAL | 0 refills | Status: DC

## 2022-09-26 MED ORDER — IBUPROFEN 400 MG PO TABS
400.0000 mg | ORAL_TABLET | Freq: Once | ORAL | Status: AC
  Administered 2022-09-26: 400 mg via ORAL
  Filled 2022-09-26: qty 1

## 2022-09-26 MED ORDER — IBUPROFEN 600 MG PO TABS: 600.0000 mg | ORAL_TABLET | Freq: Three times a day (TID) | ORAL | 0 refills | Status: DC | PRN

## 2022-09-26 MED ORDER — ACETAMINOPHEN 500 MG PO TABS
1000.0000 mg | ORAL_TABLET | Freq: Once | ORAL | Status: AC
  Administered 2022-09-26: 1000 mg via ORAL
  Filled 2022-09-26: qty 2

## 2022-09-26 NOTE — ED Notes (Signed)
Pt verbalized understanding of discharge paperwork and follow-up care. Pt verbalized she did not have money for prescriptions. RN explained to call care management in morning due to them being gone today.

## 2022-09-26 NOTE — ED Triage Notes (Signed)
Pt reports left sided, upper and lower dental pain since this morning. Reports known cavities.

## 2022-09-26 NOTE — ED Provider Notes (Signed)
[place] EMERGENCY DEPARTMENT AT [facility] Provider Note   CSN: [id number] Arrival date & time: 09/26/22  1619     History  Chief Complaint  Patient presents with   Dental Pain    Veronica Le is a 47 y.o. female.  Pt c/o left upper > lowe dental pain. Hx cavities to areas. Denies recent trauma or injury. No neck or  facial swelling or redness. No fever or chills. No sore throat. No trouble breathing or swallowing. Has not taken anything yet today for pain. No local dentist.   The history is provided by the patient and medical records.  Dental Pain Associated symptoms: no fever, no headaches and no neck pain        Home Medications Prior to Admission medications   Medication Sig Start Date End Date Taking? Authorizing Provider  FLUoxetine (PROZAC) 20 MG tablet Take 1 tablet (20 mg total) by mouth daily. 11/17/21   Mayers, Cari S, PA-C  naltrexone (DEPADE) 50 MG tablet Take 1 tablet (50 mg total) by mouth at bedtime. 11/17/21   Mayers, Cari S, PA-C  nicotine (NICODERM CQ - DOSED IN MG/24 HOURS) 14 mg/24hr patch Place 1 patch (14 mg total) onto the skin daily at 6 (six) AM. 10/30/21   Carlyn Reichert, MD      Allergies    Patient has no known allergies.    Review of Systems   Review of Systems  Constitutional:  Negative for chills and fever.  HENT:  Positive for dental problem. Negative for sore throat and trouble swallowing.   Gastrointestinal:  Negative for nausea and vomiting.  Musculoskeletal:  Negative for neck pain and neck stiffness.  Neurological:  Negative for headaches.    Physical Exam Updated Vital Signs BP 137/78 (BP Location: Right Arm)   Pulse 64   Temp 97.7 F (36.5 C)   Resp 12   SpO2 100%  Physical Exam Vitals and nursing note reviewed.  Constitutional:      Appearance: Normal appearance. She is well-developed.  HENT:     Head: Atraumatic.     Nose: Nose normal.     Mouth/Throat:     Mouth: Mucous membranes are moist.      Pharynx: Oropharynx is clear. No oropharyngeal exudate or posterior oropharyngeal erythema.     Comments: Multiple dental caries. Upper left molar with decay, broken off (appears chronically so), with associated gum swelling/tenderness, no fluctuance. No trismus. Pharynx normal. No pain, swelling or tenderness to floor of mouth or neck.  Eyes:     General: No scleral icterus.    Conjunctiva/sclera: Conjunctivae normal.  Neck:     Trachea: No tracheal deviation.  Cardiovascular:     Rate and Rhythm: Normal rate.     Pulses: Normal pulses.  Pulmonary:     Effort: Pulmonary effort is normal. No respiratory distress.     Breath sounds: No stridor.  Abdominal:     Tenderness: There is no guarding.  Musculoskeletal:        General: No swelling.     Cervical back: Normal range of motion and neck supple. No rigidity. No muscular tenderness.  Lymphadenopathy:     Cervical: No cervical adenopathy.  Skin:    General: Skin is warm and dry.     Findings: No rash.  Neurological:     Mental Status: She is alert.     Comments: Alert, speech normal.   Psychiatric:        Mood and Affect: Mood  normal.     ED Results / Procedures / Treatments   Labs (all labs ordered are listed, but only abnormal results are displayed) Labs Reviewed - No data to display  EKG None  Radiology No results found.  Procedures Procedures    Medications Ordered in ED Medications  amoxicillin (AMOXIL) capsule 500 mg (has no administration in time range)  acetaminophen (TYLENOL) tablet 1,000 mg (has no administration in time range)  ibuprofen (ADVIL) tablet 400 mg (has no administration in time range)    ED Course/ Medical Decision Making/ A&P                                 Medical Decision Making Problems Addressed: Dental abscess: acute illness or injury Dental caries: acute illness or injury Pain, dental: acute illness or injury  Amount and/or Complexity of Data Reviewed External Data Reviewed:  notes.  Risk OTC drugs. Prescription drug management.   Exam c/w dental caries, dental pain and suspected dental abscess.   Reviewed nursing notes and prior charts for additional history.   Confirmed nkda w pt. No meds pta.   Acetaminophen po, ibuprofen po, amox po, po fluids.  Rec close dental f/u.  Return precautions provided.         Final Clinical Impression(s) / ED Diagnoses Final diagnoses:  None    Rx / DC Orders ED Discharge Orders     None         Cathren Laine, MD 09/26/22 1752

## 2022-09-26 NOTE — Discharge Instructions (Addendum)
It was our pleasure to provide your ER care today - we hope that you feel better.  Take amoxicillin as prescribed. Take acetaminophen or ibuprofen as need for pain.   Follow up closely with dentist in the coming week - call office Monday AM to arrange appointment.   Return to ER if worse, new symptoms, fevers, intractable pain, severe facial swelling, trouble breathing or swallowing, or other emergency concern.

## 2023-05-02 ENCOUNTER — Emergency Department (HOSPITAL_COMMUNITY)
Admission: EM | Admit: 2023-05-02 | Discharge: 2023-05-02 | Disposition: A | Discharge status: Home / Self Care | Payer: Self-pay | Attending: Emergency Medicine | Admitting: Emergency Medicine

## 2023-05-02 DIAGNOSIS — F10129 Alcohol abuse with intoxication, unspecified: Secondary | ICD-10-CM | POA: Insufficient documentation

## 2023-05-02 DIAGNOSIS — F419 Anxiety disorder, unspecified: Secondary | ICD-10-CM | POA: Insufficient documentation

## 2023-05-02 DIAGNOSIS — R42 Dizziness and giddiness: Secondary | ICD-10-CM

## 2023-05-02 DIAGNOSIS — Y907 Blood alcohol level of 200-239 mg/100 ml: Secondary | ICD-10-CM | POA: Insufficient documentation

## 2023-05-02 DIAGNOSIS — F10920 Alcohol use, unspecified with intoxication, uncomplicated: Secondary | ICD-10-CM

## 2023-05-02 DIAGNOSIS — R0789 Other chest pain: Secondary | ICD-10-CM | POA: Insufficient documentation

## 2023-05-02 LAB — ETHANOL: Alcohol, Ethyl (B): 223 mg/dL — ABNORMAL HIGH (ref <10)

## 2023-05-02 LAB — COMPREHENSIVE METABOLIC PANEL WITH GFR
ALT: 60 U/L — ABNORMAL HIGH (ref 0–44)
AST: 147 U/L — ABNORMAL HIGH (ref 15–41)
Albumin: 3.7 g/dL (ref 3.5–5.0)
Alkaline Phosphatase: 71 U/L (ref 38–126)
Anion gap: 15 (ref 5–15)
BUN: 11 mg/dL (ref 6–20)
CO2: 19 mmol/L — ABNORMAL LOW (ref 22–32)
Calcium: 8.7 mg/dL — ABNORMAL LOW (ref 8.9–10.3)
Chloride: 105 mmol/L (ref 98–111)
Creatinine, Ser: 0.61 mg/dL (ref 0.44–1.00)
GFR, Estimated: >60 mL/min (ref >60)
Glucose, Bld: 109 mg/dL — ABNORMAL HIGH (ref 70–99)
Potassium: 4.4 mmol/L (ref 3.5–5.1)
Sodium: 139 mmol/L (ref 135–145)
Total Bilirubin: 0.3 mg/dL (ref 0.0–1.2)
Total Protein: 6.9 g/dL (ref 6.5–8.1)

## 2023-05-02 LAB — RAPID URINE DRUG SCREEN, HOSP PERFORMED
Amphetamines: NOT DETECTED
Barbiturates: NOT DETECTED
Benzodiazepines: NOT DETECTED
Cocaine: NOT DETECTED
Opiates: NOT DETECTED
Tetrahydrocannabinol: NOT DETECTED

## 2023-05-02 LAB — URINALYSIS, ROUTINE W REFLEX MICROSCOPIC
Bilirubin Urine: NEGATIVE
Glucose, UA: NEGATIVE mg/dL
Hgb urine dipstick: NEGATIVE
Ketones, ur: NEGATIVE mg/dL
Leukocytes,Ua: NEGATIVE
Nitrite: NEGATIVE
Protein, ur: NEGATIVE mg/dL
Specific Gravity, Urine: 1.025 (ref 1.005–1.030)
pH: 5 (ref 5.0–8.0)

## 2023-05-02 LAB — CBC WITH DIFFERENTIAL/PLATELET
Abs Immature Granulocytes: 0.02 10*3/uL (ref 0.00–0.07)
Basophils Absolute: 0.1 10*3/uL (ref 0.0–0.1)
Basophils Relative: 1 %
Eosinophils Absolute: 0.1 10*3/uL (ref 0.0–0.5)
Eosinophils Relative: 2 %
HCT: 38.2 % (ref 36.0–46.0)
Hemoglobin: 12.8 g/dL (ref 12.0–15.0)
Immature Granulocytes: 1 %
Lymphocytes Relative: 50 %
Lymphs Abs: 2.2 10*3/uL (ref 0.7–4.0)
MCH: 35.9 pg — ABNORMAL HIGH (ref 26.0–34.0)
MCHC: 33.5 g/dL (ref 30.0–36.0)
MCV: 107 fL — ABNORMAL HIGH (ref 80.0–100.0)
Monocytes Absolute: 0.5 10*3/uL (ref 0.1–1.0)
Monocytes Relative: 10 %
Neutro Abs: 1.6 10*3/uL — ABNORMAL LOW (ref 1.7–7.7)
Neutrophils Relative %: 36 %
Platelets: 203 10*3/uL (ref 150–400)
RBC: 3.57 MIL/uL — ABNORMAL LOW (ref 3.87–5.11)
RDW: 13.2 % (ref 11.5–15.5)
WBC: 4.4 10*3/uL (ref 4.0–10.5)
nRBC: 0 % (ref 0.0–0.2)

## 2023-05-02 NOTE — ED Provider Notes (Signed)
 [place] EMERGENCY DEPARTMENT AT [facility] Provider Note   CSN: [id number] Arrival date & time: 05/02/23  1603     History  Chief Complaint  Patient presents with   Chest Pain   Psychiatric Evaluation    Veronica Le is a 48 y.o. female.  Patient complains of having a episode where she felt as if she could not focus and as if she were going to pass out.  Patient reports that she does have a history of anxiety, she has been drinking alcohol and she is experiencing menopausal symptoms.  Patient states that this was different than anything that she has experienced in the past.  Patient states she came to the emergency department to get checked out medically.  Patient states that she can deal with her other issues that she just wants to make sure that there is nothing medically wrong.  Patient reports that she felt a fluttering in her chest and could feel her pulse in her fingers.  Patient denies any nausea vomiting diarrhea.  She denies headache.  She is not currently having any chest pain.  Patient denies any abdominal pain.  Patient states that she does have withdrawal symptoms when she does not drink that she feels anxious.  The history is provided by the patient. No language interpreter was used.  Chest Pain      Home Medications Prior to Admission medications   Medication Sig Start Date End Date Taking? Authorizing Provider  amoxicillin  (AMOXIL ) 500 MG capsule Take 1 capsule (500 mg total) by mouth 3 (three) times daily. 09/26/22   Steinl, Kevin, MD  FLUoxetine  (PROZAC ) 20 MG tablet Take 1 tablet (20 mg total) by mouth daily. 11/17/21   Mayers, Cari S, PA-C  ibuprofen  (ADVIL ) 600 MG tablet Take 1 tablet (600 mg total) by mouth every 8 (eight) hours as needed. Take with food. 09/26/22   Guadalupe Lee, MD  naltrexone  (DEPADE) 50 MG tablet Take 1 tablet (50 mg total) by mouth at bedtime. 11/17/21   Mayers, Cari S, PA-C  nicotine  (NICODERM CQ  - DOSED IN MG/24 HOURS) 14  mg/24hr patch Place 1 patch (14 mg total) onto the skin daily at 6 (six) AM. 10/30/21   Marilou Showman, MD      Allergies    Patient has no known allergies.    Review of Systems   Review of Systems  Cardiovascular:  Positive for chest pain.  All other systems reviewed and are negative.   Physical Exam Updated Vital Signs BP 126/76 (BP Location: Right Arm)   Temp 97.9 F (36.6 C) (Oral)   Resp 16   Ht 5\' 5"  (1.651 m)   Wt 90 kg   SpO2 98%   BMI 33.02 kg/m  Physical Exam Vitals and nursing note reviewed.  Constitutional:      Appearance: She is well-developed.  HENT:     Head: Normocephalic.  Cardiovascular:     Rate and Rhythm: Normal rate and regular rhythm.     Heart sounds: Normal heart sounds.  Pulmonary:     Effort: Pulmonary effort is normal.  Abdominal:     General: Bowel sounds are normal. There is no distension.     Palpations: Abdomen is soft.  Musculoskeletal:        General: Normal range of motion.     Cervical back: Normal range of motion.  Skin:    General: Skin is warm.  Neurological:     Mental Status: She is alert and oriented  to person, place, and time.  Psychiatric:        Mood and Affect: Mood is anxious.     ED Results / Procedures / Treatments   Labs (all labs ordered are listed, but only abnormal results are displayed) Labs Reviewed  CBC WITH DIFFERENTIAL/PLATELET  COMPREHENSIVE METABOLIC PANEL WITH GFR  ETHANOL  URINALYSIS, ROUTINE W REFLEX MICROSCOPIC  RAPID URINE DRUG SCREEN, HOSP PERFORMED    EKG None  Radiology No results found.  Procedures Procedures    Medications Ordered in ED Medications - No data to display  ED Course/ Medical Decision Making/ A&P                                 Medical Decision Making Patient reports a history of substance abuse and mood disorder.  Patient also reports that she has been anxious today.  Patient states she felt like she was having difficulty focusing and felt like she was  going to pass out so she came in for medical evaluation  Amount and/or Complexity of Data Reviewed Labs: ordered. Decision-making details documented in ED Course.    Details: Labs ordered reviewed and interpreted           Final Clinical Impression(s) / ED Diagnoses Final diagnoses:  Dizziness  Alcoholic intoxication without complication (HCC)    Rx / DC Orders ED Discharge Orders     None      An After Visit Summary was printed and given to the patient.    Joette Schmoker K, PA-C 05/02/23 2210    Trish Furl, MD 05/03/23 [phone]
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
## 2023-05-02 NOTE — Discharge Instructions (Signed)
 Return if any problems.

## 2023-05-02 NOTE — ED Triage Notes (Signed)
 Pt to the ed from home with  CC of dizziness with chest pressure. Pt relays hx of si thoughts denies plan but really want to be seen for her physical symptoms. Pt denies pain, loc, headache.

## 2023-06-25 ENCOUNTER — Other Ambulatory Visit: Payer: Self-pay

## 2023-06-25 ENCOUNTER — Emergency Department (HOSPITAL_COMMUNITY)
Admission: EM | Admit: 2023-06-25 | Discharge: 2023-06-25 | Disposition: A | Discharge status: Home / Self Care | Payer: Self-pay | Attending: Emergency Medicine | Admitting: Emergency Medicine

## 2023-06-25 ENCOUNTER — Encounter (HOSPITAL_COMMUNITY): Payer: Self-pay | Admitting: *Deleted

## 2023-06-25 DIAGNOSIS — R456 Violent behavior: Secondary | ICD-10-CM | POA: Insufficient documentation

## 2023-06-25 DIAGNOSIS — F1090 Alcohol use, unspecified, uncomplicated: Secondary | ICD-10-CM | POA: Insufficient documentation

## 2023-06-25 DIAGNOSIS — R451 Restlessness and agitation: Secondary | ICD-10-CM | POA: Insufficient documentation

## 2023-06-25 DIAGNOSIS — R410 Disorientation, unspecified: Secondary | ICD-10-CM | POA: Insufficient documentation

## 2023-06-25 DIAGNOSIS — Z79899 Other long term (current) drug therapy: Secondary | ICD-10-CM | POA: Insufficient documentation

## 2023-06-25 DIAGNOSIS — F1010 Alcohol abuse, uncomplicated: Secondary | ICD-10-CM

## 2023-06-25 LAB — RAPID URINE DRUG SCREEN, HOSP PERFORMED
Amphetamines: NOT DETECTED
Barbiturates: NOT DETECTED
Benzodiazepines: NOT DETECTED
Cocaine: NOT DETECTED
Opiates: NOT DETECTED
Tetrahydrocannabinol: POSITIVE — AB

## 2023-06-25 MED ORDER — ACETAMINOPHEN 325 MG PO TABS
650.0000 mg | ORAL_TABLET | Freq: Once | ORAL | Status: AC
  Administered 2023-06-25: 650 mg via ORAL
  Filled 2023-06-25: qty 2
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
## 2023-06-25 NOTE — ED Notes (Signed)
Patient verbalizes understanding of discharge instructions. Opportunity for questioning and answers were provided. Armband removed by staff, pt discharged from ED. Ambulated out to lobby, left with sister

## 2023-06-25 NOTE — ED Notes (Addendum)
 Pt alert, NAD, calm, interactive, resps e/u, speaking in clear complete sentences. Skin W&D. Social, pleasant, cooperative, and talkative. Denies sx or complaints, questions or needs.

## 2023-06-25 NOTE — ED Notes (Signed)
 Brother coordinating other ride. May be sister or group home staff. Pending plan. Pt aware. Pt talking with security at [facility]. Calm, pleasant.

## 2023-06-25 NOTE — ED Notes (Addendum)
 Verbalizes understanding that she is waiting in stretcher in ED until her brother arrives. Agreeable. Restless. Calmer. Security present. Steady gait at [facility].

## 2023-06-25 NOTE — ED Notes (Addendum)
 Pt arguing/ argumentative with EDP. Yelling, beligerant, losing control of emotions. Security called and arrives to [facility]. CN notified.

## 2023-06-25 NOTE — ED Notes (Signed)
 Spoke with sister on phone. Sister now coming to pick pt up and take her to [facility].

## 2023-06-25 NOTE — ED Notes (Signed)
 Declined blood draw. Pending arrival of brother to pick her up. Given meal and drink. Discussed plan. Pt calmer and agreeable.
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
## 2023-08-15 DIAGNOSIS — R45851 Suicidal ideations: Secondary | ICD-10-CM | POA: Insufficient documentation

## 2023-08-15 DIAGNOSIS — F332 Major depressive disorder, recurrent severe without psychotic features: Secondary | ICD-10-CM | POA: Insufficient documentation

## 2023-08-16 ENCOUNTER — Encounter (HOSPITAL_COMMUNITY): Payer: Self-pay | Admitting: Nurse Practitioner

## 2023-08-16 ENCOUNTER — Other Ambulatory Visit: Payer: Self-pay

## 2023-08-16 ENCOUNTER — Inpatient Hospital Stay (HOSPITAL_COMMUNITY)
Admission: AD | Admit: 2023-08-16 | Discharge: 2023-08-19 | DRG: 885 | Disposition: A | Discharge status: Home / Self Care | Source: Intra-hospital | Attending: Psychiatry | Admitting: Psychiatry

## 2023-08-16 ENCOUNTER — Ambulatory Visit (HOSPITAL_COMMUNITY)
Admission: EM | Admit: 2023-08-16 | Discharge: 2023-08-16 | Disposition: A | Discharge status: Other Acute Inpatient Hospital | Source: Intra-hospital | Attending: Nurse Practitioner | Admitting: Nurse Practitioner

## 2023-08-16 DIAGNOSIS — Z6841 Body Mass Index (BMI) 40.0 and over, adult: Secondary | ICD-10-CM | POA: Diagnosis not present

## 2023-08-16 DIAGNOSIS — Z5986 Financial insecurity: Secondary | ICD-10-CM

## 2023-08-16 DIAGNOSIS — E669 Obesity, unspecified: Secondary | ICD-10-CM | POA: Diagnosis present

## 2023-08-16 DIAGNOSIS — R45851 Suicidal ideations: Principal | ICD-10-CM | POA: Diagnosis present

## 2023-08-16 DIAGNOSIS — F411 Generalized anxiety disorder: Secondary | ICD-10-CM | POA: Diagnosis present

## 2023-08-16 DIAGNOSIS — F332 Major depressive disorder, recurrent severe without psychotic features: Secondary | ICD-10-CM

## 2023-08-16 LAB — COMPREHENSIVE METABOLIC PANEL WITH GFR
ALT: 10 U/L (ref 0–44)
AST: 14 U/L — ABNORMAL LOW (ref 15–41)
Albumin: 3.3 g/dL — ABNORMAL LOW (ref 3.5–5.0)
Alkaline Phosphatase: 86 U/L (ref 38–126)
Anion gap: 9 (ref 5–15)
BUN: 10 mg/dL (ref 6–20)
CO2: 22 mmol/L (ref 22–32)
Calcium: 8.9 mg/dL (ref 8.9–10.3)
Chloride: 108 mmol/L (ref 98–111)
Creatinine, Ser: 0.59 mg/dL (ref 0.44–1.00)
GFR, Estimated: >60 mL/min (ref >60)
Glucose, Bld: 94 mg/dL (ref 70–99)
Potassium: 4.4 mmol/L (ref 3.5–5.1)
Sodium: 139 mmol/L (ref 135–145)
Total Bilirubin: 0.4 mg/dL (ref 0.0–1.2)
Total Protein: 6.3 g/dL — ABNORMAL LOW (ref 6.5–8.1)

## 2023-08-16 LAB — CBC WITH DIFFERENTIAL/PLATELET
Abs Immature Granulocytes: 0.01 K/uL (ref 0.00–0.07)
Basophils Absolute: 0 K/uL (ref 0.0–0.1)
Basophils Relative: 1 %
Eosinophils Absolute: 0.2 K/uL (ref 0.0–0.5)
Eosinophils Relative: 3 %
HCT: 36 % (ref 36.0–46.0)
Hemoglobin: 12.3 g/dL (ref 12.0–15.0)
Immature Granulocytes: 0 %
Lymphocytes Relative: 49 %
Lymphs Abs: 2.5 K/uL (ref 0.7–4.0)
MCH: 32.9 pg (ref 26.0–34.0)
MCHC: 34.2 g/dL (ref 30.0–36.0)
MCV: 96.3 fL (ref 80.0–100.0)
Monocytes Absolute: 0.5 K/uL (ref 0.1–1.0)
Monocytes Relative: 10 %
Neutro Abs: 1.9 K/uL (ref 1.7–7.7)
Neutrophils Relative %: 37 %
Platelets: 341 K/uL (ref 150–400)
RBC: 3.74 MIL/uL — ABNORMAL LOW (ref 3.87–5.11)
RDW: 11.7 % (ref 11.5–15.5)
WBC: 5.1 K/uL (ref 4.0–10.5)
nRBC: 0 % (ref 0.0–0.2)

## 2023-08-16 LAB — POCT URINE DRUG SCREEN - MANUAL ENTRY (I-SCREEN)
POC Amphetamine UR: NOT DETECTED
POC Buprenorphine (BUP): NOT DETECTED
POC Cocaine UR: NOT DETECTED
POC Marijuana UR: NOT DETECTED
POC Methadone UR: NOT DETECTED
POC Methamphetamine UR: NOT DETECTED
POC Morphine: NOT DETECTED
POC Oxazepam (BZO): NOT DETECTED
POC Oxycodone UR: NOT DETECTED
POC Secobarbital (BAR): NOT DETECTED

## 2023-08-16 LAB — LIPID PANEL
Cholesterol: 114 mg/dL (ref 0–200)
HDL: 40 mg/dL — ABNORMAL LOW (ref >40)
LDL Cholesterol: 54 mg/dL (ref 0–99)
Total CHOL/HDL Ratio: 2.9 ratio
Triglycerides: 98 mg/dL (ref <150)
VLDL: 20 mg/dL (ref 0–40)

## 2023-08-16 LAB — POC URINE PREG, ED: Preg Test, Ur: NEGATIVE

## 2023-08-16 LAB — TSH: TSH: 1.466 u[IU]/mL (ref 0.350–4.500)

## 2023-08-16 LAB — ETHANOL: Alcohol, Ethyl (B): 56 mg/dL — ABNORMAL HIGH (ref <15)

## 2023-08-16 LAB — MAGNESIUM: Magnesium: 1.7 mg/dL (ref 1.7–2.4)

## 2023-08-16 MED ORDER — ALUM & MAG HYDROXIDE-SIMETH 200-200-20 MG/5ML PO SUSP: 30.0000 mL | ORAL | Status: DC | PRN

## 2023-08-16 MED ORDER — HALOPERIDOL LACTATE 5 MG/ML IJ SOLN: 5.0000 mg | Freq: Three times a day (TID) | INTRAMUSCULAR | Status: DC | PRN

## 2023-08-16 MED ORDER — HALOPERIDOL 5 MG PO TABS: 5.0000 mg | ORAL_TABLET | Freq: Three times a day (TID) | ORAL | Status: DC | PRN

## 2023-08-16 MED ORDER — HYDROXYZINE HCL 25 MG PO TABS: 25.0000 mg | ORAL_TABLET | Freq: Three times a day (TID) | ORAL | Status: DC | PRN

## 2023-08-16 MED ORDER — HYDROXYZINE HCL 25 MG PO TABS
25.0000 mg | ORAL_TABLET | Freq: Four times a day (QID) | ORAL | Status: DC | PRN
  Filled 2023-08-16: qty 10

## 2023-08-16 MED ORDER — MAGNESIUM HYDROXIDE 400 MG/5ML PO SUSP: 30.0000 mL | Freq: Every day | ORAL | Status: DC | PRN

## 2023-08-16 MED ORDER — DIPHENHYDRAMINE HCL 25 MG PO CAPS: 50.0000 mg | ORAL_CAPSULE | Freq: Three times a day (TID) | ORAL | Status: DC | PRN

## 2023-08-16 MED ORDER — ACETAMINOPHEN 325 MG PO TABS: 650.0000 mg | ORAL_TABLET | Freq: Four times a day (QID) | ORAL | Status: DC | PRN

## 2023-08-16 MED ORDER — DIPHENHYDRAMINE HCL 50 MG/ML IJ SOLN: 50.0000 mg | Freq: Three times a day (TID) | INTRAMUSCULAR | Status: DC | PRN

## 2023-08-16 MED ORDER — LORAZEPAM 1 MG PO TABS
1.0000 mg | ORAL_TABLET | Freq: Four times a day (QID) | ORAL | Status: DC
  Filled 2023-08-16: qty 1

## 2023-08-16 MED ORDER — LORAZEPAM 2 MG/ML IJ SOLN: 2.0000 mg | Freq: Three times a day (TID) | INTRAMUSCULAR | Status: DC | PRN

## 2023-08-16 MED ORDER — DIPHENHYDRAMINE HCL 25 MG PO CAPS
50.0000 mg | ORAL_CAPSULE | Freq: Three times a day (TID) | ORAL | Status: DC | PRN
Start: 2023-08-16 — End: 2023-08-17

## 2023-08-16 MED ORDER — TRAZODONE HCL 50 MG PO TABS: 50.0000 mg | ORAL_TABLET | Freq: Every evening | ORAL | Status: DC | PRN

## 2023-08-16 MED ORDER — LORAZEPAM 1 MG PO TABS: 1.0000 mg | ORAL_TABLET | Freq: Three times a day (TID) | ORAL | Status: DC

## 2023-08-16 MED ORDER — LORAZEPAM 1 MG PO TABS: 1.0000 mg | ORAL_TABLET | Freq: Every day | ORAL | Status: DC

## 2023-08-16 MED ORDER — HALOPERIDOL LACTATE 5 MG/ML IJ SOLN: 10.0000 mg | Freq: Three times a day (TID) | INTRAMUSCULAR | Status: DC | PRN

## 2023-08-16 MED ORDER — LORAZEPAM 1 MG PO TABS: 1.0000 mg | ORAL_TABLET | Freq: Two times a day (BID) | ORAL | Status: DC

## 2023-08-16 MED ORDER — LORAZEPAM 1 MG PO TABS: 1.0000 mg | ORAL_TABLET | Freq: Four times a day (QID) | ORAL | Status: DC | PRN

## 2023-08-16 MED ORDER — ONDANSETRON 4 MG PO TBDP: 4.0000 mg | ORAL_TABLET | Freq: Four times a day (QID) | ORAL | Status: DC | PRN

## 2023-08-16 MED ORDER — DIPHENHYDRAMINE HCL 50 MG PO CAPS: 50.0000 mg | ORAL_CAPSULE | Freq: Three times a day (TID) | ORAL | Status: DC | PRN

## 2023-08-16 MED ORDER — VITAMIN B-1 100 MG PO TABS
100.0000 mg | ORAL_TABLET | Freq: Every day | ORAL | Status: DC
  Administered 2023-08-18: 100 mg via ORAL
  Filled 2023-08-16 (×3): qty 1

## 2023-08-16 MED ORDER — ARIPIPRAZOLE 5 MG PO TABS
5.0000 mg | ORAL_TABLET | Freq: Once | ORAL | Status: AC
  Administered 2023-08-16: 5 mg via ORAL
  Filled 2023-08-16: qty 1

## 2023-08-16 MED ORDER — THIAMINE HCL 100 MG/ML IJ SOLN: 100.0000 mg | Freq: Once | INTRAMUSCULAR | Status: DC

## 2023-08-16 MED ORDER — ARIPIPRAZOLE 5 MG PO TABS
5.0000 mg | ORAL_TABLET | Freq: Every day | ORAL | Status: DC
Start: 2023-08-16 — End: 2023-08-16
  Administered 2023-08-16: 5 mg via ORAL
  Filled 2023-08-16: qty 1

## 2023-08-16 MED ORDER — TRAZODONE HCL 50 MG PO TABS
50.0000 mg | ORAL_TABLET | Freq: Every evening | ORAL | Status: DC | PRN
Start: 2023-08-16 — End: 2023-08-17

## 2023-08-16 MED ORDER — ADULT MULTIVITAMIN W/MINERALS CH
1.0000 | ORAL_TABLET | Freq: Every day | ORAL | Status: DC
  Administered 2023-08-18: 1 via ORAL
  Filled 2023-08-16 (×3): qty 1

## 2023-08-16 MED ORDER — LOPERAMIDE HCL 2 MG PO CAPS: 2.0000 mg | ORAL_CAPSULE | ORAL | Status: DC | PRN

## 2023-08-16 MED ORDER — ARIPIPRAZOLE 5 MG PO TABS
5.0000 mg | ORAL_TABLET | Freq: Every day | ORAL | Status: DC
  Filled 2023-08-16 (×3): qty 1
  Filled 2023-08-16: qty 7

## 2023-08-16 MED ORDER — TRAZODONE HCL 50 MG PO TABS
50.0000 mg | ORAL_TABLET | Freq: Every evening | ORAL | Status: DC | PRN
  Filled 2023-08-16: qty 7

## 2023-08-16 NOTE — Progress Notes (Signed)
 Patient admitted Voluntary from [facility] due to Suicidal Ideation with plan to overdose with pills and alcohol. Patient presents tearful, depressed and anxious. Patient endorses SI with same plan at present but verbalized she was able to contract for safety while on the unit. Patient denies past medical history or currently being on medication. Patient reports being sober for about 6 weeks and relapsed last night. Patients belongings searched and secured. Patients skin searched with no findings except mild to moderate non pitting swelling to bilateral lower extremities. Patients admission forms signed and patient was oriented to the unit and her room.
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
## 2023-08-16 NOTE — ED Notes (Signed)
 Patient resting in lounger with eyes closed, respirations even and unlabored. Patient in no apparent acute distress. Environment secured. Safety checks in place per facility protocol.

## 2023-08-16 NOTE — ED Provider Notes (Signed)
 [facility]BC/OBS ASAP Discharge Summary  Date and Time: 08/16/2023 10:18 AM  Name: Veronica Le  MRN:  [id number]   Discharge Diagnoses:  Final diagnoses:  Severe episode of recurrent major depressive disorder, without psychotic features (HCC)   HPI: Veronica Le ([id number]) is a 48 y.o. female w/ hx of AUD (rehab hx), GAD, adjustment disorder, no past psych hops per chart review, no hx suicide attempt who presents with worsening depression and suicidal thoughts with plan to overdose in the setting of return to alcohol use.   Subjective:  Patient is tearful on interiew and reports that she has been struggling. 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 Reports that she is still having suicidal thoughts. Says that she often gest emotional but this time she feels her depression is beyond just emotions and she can rationalize the thoughts to harm self. Pt is still open to hospitalization voluntarily. Not sure if she will do residential treatment or not.   Stay Summary: started on abilify  overnight but was not documented why. Morning provider discontinue given abilify  monotherapy not FDA indicated or off label for MDD, and will defer to inpatient team for appropriate eval and starting medication. Pt was irritability for the way she was treated on evaluation stating some people shouldn't be in this field. Pt agreeable to voluntary hospitalization. Pt initiatively refused labs but is agreeable this morning to routine lab work to facilitate inpatient transfer. No obvious alc withdrawal symptoms.   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[place] [employer] but unsure current employment status Tobacco Cessation:  N/A, patient does not currently use tobacco products  Current Medications:  Current Facility-Administered Medications  Medication Dose Route Frequency Provider Last Rate Last Admin   acetaminophen  (TYLENOL ) tablet 650 mg  650 mg Oral Q6H PRN Bobbitt, Shalon E, NP       alum & mag hydroxide-simeth (MAALOX/MYLANTA) 200-200-20 MG/5ML suspension 30 mL  30 mL Oral Q4H PRN Bobbitt, Shalon E, NP       haloperidol  (HALDOL ) tablet 5 mg  5 mg Oral TID PRN Bobbitt, Shalon E, NP       And   diphenhydrAMINE  (BENADRYL ) capsule 50 mg  50 mg Oral TID PRN Bobbitt, Shalon E, NP       haloperidol  lactate (HALDOL ) injection 5 mg  5 mg Intramuscular TID PRN Bobbitt, Shalon E, NP       And   diphenhydrAMINE  (BENADRYL ) injection 50 mg  50 mg Intramuscular TID PRN Bobbitt, Shalon E, NP       And   LORazepam  (ATIVAN ) injection 2 mg  2 mg Intramuscular TID PRN Bobbitt, Shalon E, NP       haloperidol  lactate (HALDOL ) injection 10 mg  10 mg Intramuscular TID PRN Bobbitt, Shalon E, NP       And   diphenhydrAMINE  (BENADRYL ) injection 50 mg  50 mg Intramuscular TID PRN Bobbitt, Shalon E, NP       And  LORazepam  (ATIVAN ) injection 2 mg  2 mg Intramuscular TID PRN Bobbitt, Shalon E, NP       hydrOXYzine  (ATARAX ) tablet 25 mg  25 mg Oral TID PRN Bobbitt, Shalon E, NP       magnesium  hydroxide (MILK OF MAGNESIA) suspension 30 mL  30 mL Oral Daily PRN Bobbitt, Shalon E, NP       traZODone  (DESYREL ) tablet 50 mg  50 mg Oral QHS PRN Bobbitt, Shalon E, NP       No current outpatient medications on file.    PTA Medications:  Facility Ordered Medications  Medication   acetaminophen  (TYLENOL ) tablet 650 mg   alum & mag hydroxide-simeth  (MAALOX/MYLANTA) 200-200-20 MG/5ML suspension 30 mL   magnesium  hydroxide (MILK OF MAGNESIA) suspension 30 mL   haloperidol  (HALDOL ) tablet 5 mg   And   diphenhydrAMINE  (BENADRYL ) capsule 50 mg   haloperidol  lactate (HALDOL ) injection 5 mg   And   diphenhydrAMINE  (BENADRYL ) injection 50 mg   And   LORazepam  (ATIVAN ) injection 2 mg   haloperidol  lactate (HALDOL ) injection 10 mg   And   diphenhydrAMINE  (BENADRYL ) injection 50 mg   And   LORazepam  (ATIVAN ) injection 2 mg   hydrOXYzine  (ATARAX ) tablet 25 mg   traZODone  (DESYREL ) tablet 50 mg   [COMPLETED] ARIPiprazole  (ABILIFY ) tablet 5 mg       11/05/2021    7:03 AM 10/30/2021    6:56 AM  Depression screen PHQ 2/9  Decreased Interest 1 1  Down, Depressed, Hopeless 2 2  PHQ - 2 Score 3 3  Altered sleeping 1 1  Tired, decreased energy 1 2  Change in appetite 1 2  Feeling bad or failure about yourself  2 3  Trouble concentrating 0 2  Moving slowly or fidgety/restless 0 2  Suicidal thoughts 0 0  PHQ-9 Score 8 15  Difficult doing work/chores Somewhat difficult Very difficult    Flowsheet Row ED from 08/16/2023 in [facility] ED from 06/25/2023 in [facility] Emergency Department at [facility] ED from 05/02/2023 in [facility] Emergency Department at [facility]  C-SSRS RISK CATEGORY Error: Q3, 4, or 5 should not be populated when Q2 is No No Risk No Risk    Musculoskeletal  Strength & Muscle Tone: within normal limits Gait & Station: normal Patient leans: N/A  Psychiatric Specialty Exam  Presentation  General Appearance:  Casual  Eye Contact: Fair  Speech: Clear and Coherent; Slow  Speech Volume: Decreased  Handedness: Right   Mood and Affect  Mood: Depressed  Affect: Appropriate   Thought Process  Thought Processes: Coherent  Descriptions of Associations:Intact  Orientation:Full (Time, Place and Person)  Thought Content:WDL  Diagnosis of  Schizophrenia or Schizoaffective disorder in past: No    Hallucinations:Hallucinations: None  Ideas of Reference:None  Suicidal Thoughts:Suicidal Thoughts: Yes, Active SI Active Intent and/or Plan: With Intent; With Plan Plan to overdose  Homicidal Thoughts:Homicidal Thoughts: No   Sensorium  Memory: Immediate Good; Recent Good; Remote Good  Judgment: Poor  Insight: Poor   Executive Functions  Concentration: Fair  Attention Span: Fair  Recall: Fair  Fund of Knowledge: Fair  Language: Fair   Psychomotor Activity  Psychomotor Activity: Psychomotor Activity: Normal   Assets  Assets: Desire for Improvement; Physical Health; Housing; Communication Skills   Sleep  Sleep: Sleep: Fair  IT consultant Durations: No data on file for Sleeping  Nutritional Assessment (For OBS and [facility] admissions only) Has the patient had  a weight loss or gain of 10 pounds or more in the last 3 months?: No Has the patient had a decrease in food intake/or appetite?: No Does the patient have dental problems?: No Does the patient have eating habits or behaviors that may be indicators of an eating disorder including binging or inducing vomiting?: No Has the patient recently lost weight without trying?: 0 Has the patient been eating poorly because of a decreased appetite?: 0 Malnutrition Screening Tool Score: 0    Physical Exam  Physical Exam Vitals and nursing note reviewed.  Constitutional:      General: She is not in acute distress. HENT:     Head: Normocephalic and atraumatic.  Pulmonary:     Effort: Pulmonary effort is normal.  Neurological:     General: No focal deficit present.     Mental Status: She is alert.    Review of Systems  Constitutional:  Negative for fever.  Cardiovascular:  Negative for chest pain and palpitations.  Gastrointestinal:  Negative for constipation, diarrhea, nausea and vomiting.  Neurological:  Negative for dizziness, weakness and  headaches.   Blood pressure 112/65, pulse 93, temperature 98.3 F (36.8 C), temperature source Oral, resp. rate 18, SpO2 98%, unknown if currently breastfeeding. There is no height or weight on file to calculate BMI.  Demographic Factors:  Low socioeconomic status and Unemployed  Loss Factors: Decrease in vocational status and Financial problems/change in socioeconomic status  Historical Factors: NA  Risk Reduction Factors:   NA  Continued Clinical Symptoms:  Severe Anxiety and/or Agitation Depression:   Anhedonia Comorbid alcohol abuse/dependence Hopelessness Severe  Cognitive Features That Contribute To Risk:  Polarized thinking    Suicide Risk:  Moderate:  Frequent suicidal ideation with limited intensity, and duration, some specificity in terms of plans, some associated intent, dysphoric and crying throughout exams, some risk factors present, and no significant protective factors. No hx of suicide attempts or psych hospitalizations. Previously not on medications.   Plan Of Care/Follow-up recommendations:  Transfer to inpatient psych. Overnight started on abilify  monotherapy but discontinued given lack of evidence as monotherapy for MDD and to defer to inpatient team for starting medication, likely SSRI and possibly buspar for anxiety. Pt has been to rehab in the past and may benefit from rehab after hosp. Patient was in [facility] so will need help getting re-established with [facility] if not going to residential.   Disposition: [facility], pending labs WNL  Justino Cornish, MD PGY-2 Psychiatry Resident 08/16/2023, 10:18 AM

## 2023-08-16 NOTE — ED Notes (Addendum)
 Patient, admitted for anxiety, Depression and alcohol abuse. Patient argued on her medication Abilify  5 mg po scheduled for this time. She said, why should I take this medication, I am not diagnosed of any psychotic issues. After long discussion she admitted that she has depression and anxiety problems. It was confirmed to her that Abilify  alleviate symptoms of depression and anxiety. Then medication administered to her. She was little bit nervous and argumentative for a while and then calmed down. She denied SI;HI. Denied positive psychotic symptoms. Alert and oriented X 4. Her speech is clear. Skin integrity intact. No BM issue reported. No sleep disturbances reported. We continue caring.

## 2023-08-16 NOTE — Tx Team (Signed)
 Initial Treatment Plan 08/16/2023 1:47 PM Ryliee Howerter FMW:[id number]    PATIENT STRESSORS: Financial difficulties   Substance abuse     PATIENT STRENGTHS: Ability for insight  Average or above average intelligence  Communication skills  General fund of knowledge    PATIENT IDENTIFIED PROBLEMS: Substance Abuse Suicidal Ideation With plan                     DISCHARGE CRITERIA:  Ability to meet basic life and health needs Adequate post-discharge living arrangements Improved stabilization in mood, thinking, and/or behavior Need for constant or close observation no longer present Reduction of life-threatening or endangering symptoms to within safe limits Verbal commitment to aftercare and medication compliance  PRELIMINARY DISCHARGE PLAN: Return to previous living arrangement  PATIENT/FAMILY INVOLVEMENT: This treatment plan has been presented to and reviewed with the patient, Audia Boehringer.  The patient has been given the opportunity to ask questions and make suggestions.  Ronnald CHRISTELLA Boyer, RN 08/16/2023, 1:47 PM

## 2023-08-16 NOTE — ED Notes (Addendum)
 Pt was rude upon greeting in assessment room when staff tried to get work up for admission.  This Clinical research associate identified myself and told pt what was ordered prior to coming on the unit.  Pt told this Clinical research associate that I was rude by busting in her room and greeting her, repeating this writers name 2xs and loudly shouted I want someone else to draw my blood!  This writer was able to get urine from pt but was unable to redirect for blood work.  This Freight forwarder and other staff that pt was targeting this Clinical research associate and to switch out to complete task.  Pt was brought onto the unit and continued to target this Clinical research associate after pt asked to speak to someone other than this Clinical research associate.  This Clinical research associate got another staff to talk to pt and pt asked for socks but other staff could not find them so this writer was helping get the socks for other staff. The pt seen this writer through glass window and started to yell  There she go again being rude! This Clinical research associate did not engage in any conversation with pt.  Staff gave socks to pt and she laid down after asking staff Who is in charge?

## 2023-08-16 NOTE — ED Provider Notes (Signed)
 [facility] Continuous Assessment Admission H&P  Date: 08/16/23 Patient Name: Veronica Le MRN: [id number] Chief Complaint: suicidal ideation  Diagnoses:  Final diagnoses:  Severe episode of recurrent major depressive disorder, without psychotic features (HCC)    HPI: Veronica Le is a 48 y/o single female unemployed with history of alcohol abuse and generalized anxiety disorder presented voluntarily unaccompanied byto [facility] for suicidal ideation with a plan and intent to overdose on pills and alcohol. Patient was living at [facility].  Veronica Le, 48 y.o., female patient seen face to face by this provider and chart reviewed on 08/16/23.  On evaluation Veronica Le reports that she has been feeling suicidal for a few weeks. Tonight drank a bottle of wine and planned to to overdose on pills. Patient reports that she has no purpose in living and would be better of dead. Patient reports that she relapsed drinking alcohol today after being sober for three weeks. Patient endorses smoking marijuana occasionally, UDS is negative for illicit substances.    When patient initially arrived to the facility she was very irritable and labile and refused to speak to the triage tech. Patient was able to calm down and speak to TTS counselor. During evaluation Veronica Le is sitting calmly in the assessment room with her head down on the table, but in no acute distress. Patient lifts her head when her name is called and is calm and cooperative, alert x 4, calm. Her mood is labile with a depressed affect. She has normal speech, and behavior. Objectively there is no evidence of psychosis/mania or delusional thinking.  Patient is able to converse coherently, goal directed thoughts, no distractibility, or pre-occupation. She endorses suicidal ideation. Patient denies any homicidal ideation, psychosis, and paranoia.  Patient answered question appropriately.      Patient recommended for inpatient  treatment and will be admitted to [facility] continuous observation for crisis management, safety and stabilization. Patient has been accepted by [facility] for inpatient hospitalization at [facility]. Nursing was not able to obtain labs during the night and will attempt again this morning.   Total Time spent with patient: 20 minutes  Musculoskeletal  Strength & Muscle Tone: within normal limits Gait & Station: normal Patient leans: N/A  Psychiatric Specialty Exam  Presentation General Appearance:  Casual  Eye Contact: Fair  Speech: Clear and Coherent; Slow  Speech Volume: Decreased  Handedness: Right   Mood and Affect  Mood: Depressed  Affect: Appropriate   Thought Process  Thought Processes: Coherent  Descriptions of Associations:Intact  Orientation:Full (Time, Place and Person)  Thought Content:WDL  Diagnosis of Schizophrenia or Schizoaffective disorder in past: No   Hallucinations:Hallucinations: None  Ideas of Reference:None  Suicidal Thoughts:Suicidal Thoughts: Yes, Active SI Active Intent and/or Plan: With Intent; With Plan  Homicidal Thoughts:Homicidal Thoughts: No   Sensorium  Memory: Immediate Good; Recent Good; Remote Good  Judgment: Poor  Insight: Poor   Executive Functions  Concentration: Fair  Attention Span: Fair  Recall: Fair  Fund of Knowledge: Fair  Language: Fair   Psychomotor Activity  Psychomotor Activity: Psychomotor Activity: Normal   Assets  Assets: Desire for Improvement; Physical Health; Housing; Communication Skills   Sleep  Sleep: Sleep: Fair Number of Hours of Sleep: 6   Nutritional Assessment (For OBS and FBC admissions only) Has the patient had a weight loss or gain of 10 pounds or more in the last 3 months?: No Has the patient had a decrease in food intake/or appetite?: No Does the patient  have dental problems?: No Does the patient have eating habits or behaviors that may be indicators of an  eating disorder including binging or inducing vomiting?: No Has the patient recently lost weight without trying?: 0 Has the patient been eating poorly because of a decreased appetite?: 0 Malnutrition Screening Tool Score: 0    Physical Exam HENT:     Head: Normocephalic.     Nose: Nose normal.  Eyes:     Pupils: Pupils are equal, round, and reactive to light.  Pulmonary:     Effort: Pulmonary effort is normal.  Abdominal:     General: Abdomen is flat.  Musculoskeletal:        General: Normal range of motion.     Cervical back: Normal range of motion.  Skin:    General: Skin is warm.  Neurological:     Mental Status: She is alert and oriented to person, place, and time.  Psychiatric:        Attention and Perception: Attention normal.        Mood and Affect: Mood is anxious. Affect is labile.        Speech: Speech normal.        Behavior: Behavior is agitated.        Thought Content: Thought content is not paranoid or delusional. Thought content includes suicidal ideation. Thought content does not include homicidal ideation. Thought content includes suicidal plan. Thought content does not include homicidal plan.        Cognition and Memory: Cognition normal.        Judgment: Judgment is impulsive.    Review of Systems  Constitutional: Negative.   HENT: Negative.    Eyes: Negative.   Respiratory: Negative.    Cardiovascular: Negative.   Gastrointestinal: Negative.   Genitourinary: Negative.   Musculoskeletal: Negative.   Skin: Negative.   Neurological: Negative.   Endo/Heme/Allergies: Negative.   Psychiatric/Behavioral:  Positive for suicidal ideas. The patient is nervous/anxious.     Blood pressure (!) 133/95, pulse 97, temperature 98.8 F (37.1 C), temperature source Oral, resp. rate 20, SpO2 99%, unknown if currently breastfeeding. There is no height or weight on file to calculate BMI.  Past Psychiatric History: Alcohol abuse, GAD  Is the patient at risk to self?  Yes  Has the patient been a risk to self in the past 6 months? No .    Has the patient been a risk to self within the distant past? No   Is the patient a risk to others? No   Has the patient been a risk to others in the past 6 months? No   Has the patient been a risk to others within the distant past? No   Past Medical History: No significant medical history  Family History: unknown  Social History: 48 year old female single, with history of alcohol abuse and GAD, currently living at [facility]  Last Labs:  Admission on 08/16/2023  Component Date Value Ref Range Status   POC Amphetamine UR 08/16/2023 None Detected  NONE DETECTED (Cut Off Level 1000 ng/mL) Final   POC Secobarbital (BAR) 08/16/2023 None Detected  NONE DETECTED (Cut Off Level 300 ng/mL) Final   POC Buprenorphine (BUP) 08/16/2023 None Detected  NONE DETECTED (Cut Off Level 10 ng/mL) Final   POC Oxazepam (BZO) 08/16/2023 None Detected  NONE DETECTED (Cut Off Level 300 ng/mL) Final   POC Cocaine UR 08/16/2023 None Detected  NONE DETECTED (Cut Off Level 300 ng/mL) Final   POC Methamphetamine UR 08/16/2023  None Detected  NONE DETECTED (Cut Off Level 1000 ng/mL) Final   POC Morphine  08/16/2023 None Detected  NONE DETECTED (Cut Off Level 300 ng/mL) Final   POC Methadone UR 08/16/2023 None Detected  NONE DETECTED (Cut Off Level 300 ng/mL) Final   POC Oxycodone  UR 08/16/2023 None Detected  NONE DETECTED (Cut Off Level 100 ng/mL) Final   POC Marijuana UR 08/16/2023 None Detected  NONE DETECTED (Cut Off Level 50 ng/mL) Final   Preg Test, Ur 08/16/2023 Negative  Negative Final  Admission on 06/25/2023, Discharged on 06/25/2023  Component Date Value Ref Range Status   Opiates 06/25/2023 NONE DETECTED  NONE DETECTED Final   Cocaine 06/25/2023 NONE DETECTED  NONE DETECTED Final   Benzodiazepines 06/25/2023 NONE DETECTED  NONE DETECTED Final   Amphetamines 06/25/2023 NONE DETECTED  NONE DETECTED Final   Tetrahydrocannabinol  06/25/2023 POSITIVE (A)  NONE DETECTED Final   Barbiturates 06/25/2023 NONE DETECTED  NONE DETECTED Final   Comment: (NOTE) DRUG SCREEN FOR MEDICAL PURPOSES ONLY.  IF CONFIRMATION IS NEEDED FOR ANY PURPOSE, NOTIFY LAB WITHIN 5 DAYS.  LOWEST DETECTABLE LIMITS FOR URINE DRUG SCREEN Drug Class                     Cutoff (ng/mL) Amphetamine and metabolites    1000 Barbiturate and metabolites    200 Benzodiazepine                 200 Opiates and metabolites        300 Cocaine and metabolites        300 THC                            50 Performed at [facility], [address], [place], [place] [address]   Admission on 05/02/2023, Discharged on 05/02/2023  Component Date Value Ref Range Status   WBC 05/02/2023 4.4  4.0 - 10.5 K/uL Final   RBC 05/02/2023 3.57 (L)  3.87 - 5.11 MIL/uL Final   Hemoglobin 05/02/2023 12.8  12.0 - 15.0 g/dL Final   HCT 95/79/7974 38.2  36.0 - 46.0 % Final   MCV 05/02/2023 107.0 (H)  80.0 - 100.0 fL Final   MCH 05/02/2023 35.9 (H)  26.0 - 34.0 pg Final   MCHC 05/02/2023 33.5  30.0 - 36.0 g/dL Final   RDW 95/79/7974 13.2  11.5 - 15.5 % Final   Platelets 05/02/2023 203  150 - 400 K/uL Final   nRBC 05/02/2023 0.0  0.0 - 0.2 % Final   Neutrophils Relative % 05/02/2023 36  % Final   Neutro Abs 05/02/2023 1.6 (L)  1.7 - 7.7 K/uL Final   Lymphocytes Relative 05/02/2023 50  % Final   Lymphs Abs 05/02/2023 2.2  0.7 - 4.0 K/uL Final   Monocytes Relative 05/02/2023 10  % Final   Monocytes Absolute 05/02/2023 0.5  0.1 - 1.0 K/uL Final   Eosinophils Relative 05/02/2023 2  % Final   Eosinophils Absolute 05/02/2023 0.1  0.0 - 0.5 K/uL Final   Basophils Relative 05/02/2023 1  % Final   Basophils Absolute 05/02/2023 0.1  0.0 - 0.1 K/uL Final   Immature Granulocytes 05/02/2023 1  % Final   Abs Immature Granulocytes 05/02/2023 0.02  0.00 - 0.07 K/uL Final   Performed at [facility], [address] [place], [place] [address]   Sodium 05/02/2023  139  135 - 145 mmol/L Final  Potassium 05/02/2023 4.4  3.5 - 5.1 mmol/L Final   HEMOLYSIS AT THIS LEVEL MAY AFFECT RESULT   Chloride 05/02/2023 105  98 - 111 mmol/L Final   CO2 05/02/2023 19 (L)  22 - 32 mmol/L Final   Glucose, Bld 05/02/2023 109 (H)  70 - 99 mg/dL Final   Glucose reference range applies only to samples taken after fasting for at least 8 hours.   BUN 05/02/2023 11  6 - 20 mg/dL Final   Creatinine, Ser 05/02/2023 0.61  0.44 - 1.00 mg/dL Final   Calcium 95/79/7974 8.7 (L)  8.9 - 10.3 mg/dL Final   Total Protein 95/79/7974 6.9  6.5 - 8.1 g/dL Final   Albumin 95/79/7974 3.7  3.5 - 5.0 g/dL Final   AST 95/79/7974 147 (H)  15 - 41 U/L Final   HEMOLYSIS AT THIS LEVEL MAY AFFECT RESULT   ALT 05/02/2023 60 (H)  0 - 44 U/L Final   HEMOLYSIS AT THIS LEVEL MAY AFFECT RESULT   Alkaline Phosphatase 05/02/2023 71  38 - 126 U/L Final   Total Bilirubin 05/02/2023 0.3  0.0 - 1.2 mg/dL Final   HEMOLYSIS AT THIS LEVEL MAY AFFECT RESULT   GFR, Estimated 05/02/2023 >60  >60 mL/min Final   Comment: (NOTE) Calculated using the CKD-EPI Creatinine Equation (2021)    Anion gap 05/02/2023 15  5 - 15 Final   Performed at [facility], [address], [place], [place] [address]   Alcohol, Ethyl (B) 05/02/2023 223 (H)  <10 mg/dL Final   Comment: (NOTE) For medical purposes only. Performed at [facility], [address], [place], [place] [address]    Color, Urine 05/02/2023 YELLOW  YELLOW Final   APPearance 05/02/2023 HAZY (A)  CLEAR Final   Specific Gravity, Urine 05/02/2023 1.025  1.005 - 1.030 Final   pH 05/02/2023 5.0  5.0 - 8.0 Final   Glucose, UA 05/02/2023 NEGATIVE  NEGATIVE mg/dL Final   Hgb urine dipstick 05/02/2023 NEGATIVE  NEGATIVE Final   Bilirubin Urine 05/02/2023 NEGATIVE  NEGATIVE Final   Ketones, ur 05/02/2023 NEGATIVE  NEGATIVE mg/dL Final   Protein, ur 95/79/7974 NEGATIVE  NEGATIVE mg/dL Final   Nitrite 95/79/7974 NEGATIVE  NEGATIVE Final   Leukocytes,Ua  05/02/2023 NEGATIVE  NEGATIVE Final   Performed at [facility], [address] [place], [place] [address]   Opiates 05/02/2023 NONE DETECTED  NONE DETECTED Final   Cocaine 05/02/2023 NONE DETECTED  NONE DETECTED Final   Benzodiazepines 05/02/2023 NONE DETECTED  NONE DETECTED Final   Amphetamines 05/02/2023 NONE DETECTED  NONE DETECTED Final   Tetrahydrocannabinol 05/02/2023 NONE DETECTED  NONE DETECTED Final   Barbiturates 05/02/2023 NONE DETECTED  NONE DETECTED Final   Comment: (NOTE) DRUG SCREEN FOR MEDICAL PURPOSES ONLY.  IF CONFIRMATION IS NEEDED FOR ANY PURPOSE, NOTIFY LAB WITHIN 5 DAYS.  LOWEST DETECTABLE LIMITS FOR URINE DRUG SCREEN Drug Class                     Cutoff (ng/mL) Amphetamine and metabolites    1000 Barbiturate and metabolites    200 Benzodiazepine                 200 Opiates and metabolites        300 Cocaine and metabolites        300 THC  50 Performed at [facility], [address], [place], [place] [address]     Allergies: Patient has no known allergies.  Medications:  Facility Ordered Medications  Medication   acetaminophen  (TYLENOL ) tablet 650 mg   alum & mag hydroxide-simeth (MAALOX/MYLANTA) 200-200-20 MG/5ML suspension 30 mL   magnesium  hydroxide (MILK OF MAGNESIA) suspension 30 mL   haloperidol  (HALDOL ) tablet 5 mg   And   diphenhydrAMINE  (BENADRYL ) capsule 50 mg   haloperidol  lactate (HALDOL ) injection 5 mg   And   diphenhydrAMINE  (BENADRYL ) injection 50 mg   And   LORazepam  (ATIVAN ) injection 2 mg   haloperidol  lactate (HALDOL ) injection 10 mg   And   diphenhydrAMINE  (BENADRYL ) injection 50 mg   And   LORazepam  (ATIVAN ) injection 2 mg   hydrOXYzine  (ATARAX ) tablet 25 mg   traZODone  (DESYREL ) tablet 50 mg   [COMPLETED] ARIPiprazole  (ABILIFY ) tablet 5 mg   PTA Medications  Medication Sig   nicotine  (NICODERM CQ  - DOSED IN MG/24 HOURS) 14 mg/24hr patch Place 1 patch (14 mg total) onto the skin  daily at 6 (six) AM.   FLUoxetine  (PROZAC ) 20 MG tablet Take 1 tablet (20 mg total) by mouth daily.   naltrexone  (DEPADE) 50 MG tablet Take 1 tablet (50 mg total) by mouth at bedtime.   amoxicillin  (AMOXIL ) 500 MG capsule Take 1 capsule (500 mg total) by mouth 3 (three) times daily.   ibuprofen  (ADVIL ) 600 MG tablet Take 1 tablet (600 mg total) by mouth every 8 (eight) hours as needed. Take with food.      Medical Decision Making  Alida Cadet is a 48 y/o single female with history of alcohol abuse and generalized anxiety disorder presented voluntarily unaccompanied byto [facility] for suicidal ideation with a plan and intent to overdose on pills and alcohol. Patient was living at [facility].    Recommendations  Based on my evaluation the patient does not appear to have an emergency medical condition.  Patient recommended for inpatient treatment and will be admitted to [facility] continuous observation for crisis management, safety and stabilization.  Khyla Mccumbers E Kester Stimpson, NP 08/16/23  5:51 AM

## 2023-08-16 NOTE — Plan of Care (Signed)
   Problem: Education: Goal: Knowledge of [place] Education information/materials will improve Outcome: Progressing Goal: Verbalization of understanding the information provided will improve Outcome: Progressing

## 2023-08-16 NOTE — ED Notes (Signed)
 Patient transferred to [facility]. Patient belongings from locker #19 given to transport service complete and intact. Patient continued to denied SI/HI/AVH upon transfer, no signs of acute distress. Patient stable and ambulatory. Patient escorted to back sallyport via staff, safety maintained.

## 2023-08-16 NOTE — ED Notes (Signed)
 Patient alert & oriented x4. Denies intent to harm self or others when asked. Denies A/VH. Patient denies any physical complaints when asked. No acute distress noted. Scheduled medications administered with no complications. Patient observed in milieu. Patient denies any complaints at the current time, answered all questions appropriately, with an occasional eye roll. After answering all assessment questions from writer patient gave speech regarding this Clinical research associate and the lack of empathy. Patient continued tangentially about facility staff and how patient states no one should have to go through these conditions. Support and encouragement offered, patient told this writer to just go away and stated she wanted to hear from no one besides the doctor. Per night shift RN patient has been accepted to [facility]. No acceptance note seen in chart, have not hard further update at this time. Routine safety checks conducted per facility protocol. Encouraged patient to notify staff if any thoughts of harm towards self or others arise. Patient verbalizes understanding and agreement.

## 2023-08-16 NOTE — Progress Notes (Signed)
 Pt has been accepted to. [facility] on 08/16/2023 Bed assignment: 403-01  Pt meets inpatient criteria per: Roxianne Olp NP  Attending Physician will be: Dr. Raliegh MD  Report can be called to: unit: Adult unit: [phone]  Pt can arrive after Labs and Consents   Care Team Notified: [facility] Cherylynn Ernst RN, Justino Cornish MD, [place] Ward RN, Jacquelyn Blue RN  [place] Donyea Beverlin LCSW-A   08/16/2023 9:44 AM

## 2023-08-17 LAB — VITAMIN D 25 HYDROXY (VIT D DEFICIENCY, FRACTURES): Vit D, 25-Hydroxy: 33.84 ng/mL (ref 30–100)

## 2023-08-17 LAB — HIV ANTIBODY (ROUTINE TESTING W REFLEX): HIV Screen 4th Generation wRfx: NONREACTIVE

## 2023-08-17 LAB — VITAMIN B12: Vitamin B-12: 325 pg/mL (ref 180–914)

## 2023-08-17 LAB — FOLATE: Folate: 10.3 ng/mL (ref >5.9)

## 2023-08-17 NOTE — Plan of Care (Signed)

## 2023-08-17 NOTE — H&P (Incomplete)
 Psychiatric Admission Assessment Adult  Patient Identification: Veronica Le MRN:  [id number] Date of Evaluation:  08/17/2023 Chief Complaint:  Suicidal ideation [R45.851] MDD (major depressive disorder), recurrent episode, severe (HCC) [F33.2] Principal Diagnosis: Suicidal ideation Diagnosis:  Principal Problem:   Suicidal ideation Active Problems:   MDD (major depressive disorder), recurrent episode, severe (HCC)  History of Present Illness: Veronica Le is a 48 year old female with a psychiatric history significant of alcohol use disorder, GAD, and adjustment disorder.  She was admitted to the [facility] following voluntary presentation to [facility], reporting worsening depression and suicidal ideation, including a plan to overdose.  These symptoms reportedly emerged in the context of a recent relapse into alcohol use.  Chart reviewed.  Patient seen face-to-face by this provider.  On evaluation today, patient reports   The case was reviewed with the attending psychiatrist with the treatment plan as noted below.  Associated Signs/Symptoms: Depression Symptoms:  anhedonia, feelings of worthlessness/guilt, difficulty concentrating, recurrent thoughts of death, suicidal thoughts with specific plan, anxiety, panic attacks, weight gain, (Hypo) Manic Symptoms:  Impulsivity, Irritable Mood, Anxiety Symptoms:  Excessive Worry, Panic Symptoms, Psychotic Symptoms:  Denies PTSD Symptoms: Had a traumatic exposure:  *** Re-experiencing:  Intrusive Thoughts Total Time spent with patient: 1.5 hours  Past Psychiatric History: ***  Is the patient at risk to self? Yes.    Has the patient been a risk to self in the past 6 months? No.  Has the patient been a risk to self within the distant past? No.  Is the patient a risk to others? No.  Has the patient been a risk to others in the past 6 months? No.  Has the patient been a risk to  others within the distant past? No.   [place] Scale:  Flowsheet Row [facility] (Current) [facility] Most recent reading at 08/16/2023 12:45 PM ED from 08/16/2023 in [facility] Most recent reading at 08/16/2023  5:31 AM ED from 06/25/2023 in [facility] Emergency Department at [facility] Most recent reading at 06/25/2023  3:41 PM  C-SSRS RISK CATEGORY High Risk Error: Q3, 4, or 5 should not be populated when Q2 is No No Risk     Prior Inpatient Therapy: Yes.   If yes, describe***  Prior Outpatient Therapy: Yes.   If yes, describe***   Alcohol Screening: 1. How often do you have a drink containing alcohol?: Monthly or less 2. How many drinks containing alcohol do you have on a typical day when you are drinking?: 3 or 4 3. How often do you have six or more drinks on one occasion?: Less than monthly AUDIT-C Score: 3 4. How often during the last year have you found that you were not able to stop drinking once you had started?: Daily or almost daily 5. How often during the last year have you failed to do what was normally expected from you because of drinking?: Never 6. How often during the last year have you needed a first drink in the morning to get yourself going after a heavy drinking session?: Never 7. How often during the last year have you had a feeling of guilt of remorse after drinking?: Never 8. How often during the last year have you been unable to remember what happened the night before because you had been drinking?: Never 9. Have you or someone else been injured as a result of your drinking?: No 10.  Has a relative or friend or a doctor or another health worker been concerned about your drinking or suggested you cut down?: No Alcohol Use Disorder Identification Test Final Score (AUDIT): 7 Alcohol Brief Interventions/Follow-up: Alcohol education/Brief advice Substance Abuse History in the last 12  months:  Yes.   Consequences of Substance Abuse: DT's: *** Withdrawal Symptoms:   Tremors Previous Psychotropic Medications: Yes  Psychological Evaluations: Yes  Past Medical History:  Past Medical History:  Diagnosis Date   Adjustment disorder    Alcohol abuse    Medical history non-contributory    Panic attack    Substance induced mood disorder (HCC)     Past Surgical History:  Procedure Laterality Date   NO PAST SURGERIES     ORIF ANKLE FRACTURE Left 11/01/2019   Procedure: OPEN REDUCTION INTERNAL FIXATION (ORIF) LEFT ANKLE FRACTURE;  Surgeon: Addie Cordella Hamilton, MD;  Location: [facility] OR;  Service: Orthopedics;  Laterality: Left;   Family History: History reviewed. No pertinent family history. Family Psychiatric  History: As listed above Tobacco Screening:  Social History   Tobacco Use  Smoking Status Some Days   Types: Cigarettes  Smokeless Tobacco Never    BH Tobacco Counseling     Are you interested in Tobacco Cessation Medications?  No, patient refused Counseled patient on smoking cessation:  Refused/Declined practical counseling Reason Tobacco Screening Not Completed: Patient Refused Screening       Social History:  Social History   Substance and Sexual Activity  Alcohol Use Yes     Social History   Substance and Sexual Activity  Drug Use Not Currently   Types: Marijuana    Additional Social History:                           Allergies:  No Known Allergies Lab Results:  Results for orders placed or performed during the hospital encounter of 08/16/23 (from the past 48 hours)  POCT Urine Drug Screen - (I-Screen)     Status: Normal   Collection Time: 08/16/23  5:20 AM  Result Value Ref Range   POC Amphetamine UR None Detected NONE DETECTED (Cut Off Level 1000 ng/mL)   POC Secobarbital (BAR) None Detected NONE DETECTED (Cut Off Level 300 ng/mL)   POC Buprenorphine (BUP) None Detected NONE DETECTED (Cut Off Level 10 ng/mL)   POC Oxazepam  (BZO) None Detected NONE DETECTED (Cut Off Level 300 ng/mL)   POC Cocaine UR None Detected NONE DETECTED (Cut Off Level 300 ng/mL)   POC Methamphetamine UR None Detected NONE DETECTED (Cut Off Level 1000 ng/mL)   POC Morphine  None Detected NONE DETECTED (Cut Off Level 300 ng/mL)   POC Methadone UR None Detected NONE DETECTED (Cut Off Level 300 ng/mL)   POC Oxycodone  UR None Detected NONE DETECTED (Cut Off Level 100 ng/mL)   POC Marijuana UR None Detected NONE DETECTED (Cut Off Level 50 ng/mL)  POC urine preg, ED     Status: Normal   Collection Time: 08/16/23  5:20 AM  Result Value Ref Range   Preg Test, Ur Negative Negative  CBC with Differential/Platelet     Status: Abnormal   Collection Time: 08/16/23 10:43 AM  Result Value Ref Range   WBC 5.1 4.0 - 10.5 K/uL   RBC 3.74 (L) 3.87 - 5.11 MIL/uL   Hemoglobin 12.3 12.0 - 15.0 g/dL   HCT 63.9 63.9 - 53.9 %   MCV 96.3 80.0 - 100.0 fL   MCH  32.9 26.0 - 34.0 pg   MCHC 34.2 30.0 - 36.0 g/dL   RDW 88.2 88.4 - 84.4 %   Platelets 341 150 - 400 K/uL   nRBC 0.0 0.0 - 0.2 %   Neutrophils Relative % 37 %   Neutro Abs 1.9 1.7 - 7.7 K/uL   Lymphocytes Relative 49 %   Lymphs Abs 2.5 0.7 - 4.0 K/uL   Monocytes Relative 10 %   Monocytes Absolute 0.5 0.1 - 1.0 K/uL   Eosinophils Relative 3 %   Eosinophils Absolute 0.2 0.0 - 0.5 K/uL   Basophils Relative 1 %   Basophils Absolute 0.0 0.0 - 0.1 K/uL   Immature Granulocytes 0 %   Abs Immature Granulocytes 0.01 0.00 - 0.07 K/uL    Comment: Performed at [facility], [address] [place], [place] [address]  Comprehensive metabolic panel     Status: Abnormal   Collection Time: 08/16/23 10:43 AM  Result Value Ref Range   Sodium 139 135 - 145 mmol/L   Potassium 4.4 3.5 - 5.1 mmol/L   Chloride 108 98 - 111 mmol/L   CO2 22 22 - 32 mmol/L   Glucose, Bld 94 70 - 99 mg/dL    Comment: Glucose reference range applies only to samples taken after fasting for at least 8 hours.   BUN 10 6 - 20 mg/dL    Creatinine, Ser 9.40 0.44 - 1.00 mg/dL   Calcium 8.9 8.9 - 89.6 mg/dL   Total Protein 6.3 (L) 6.5 - 8.1 g/dL   Albumin 3.3 (L) 3.5 - 5.0 g/dL   AST 14 (L) 15 - 41 U/L   ALT 10 0 - 44 U/L   Alkaline Phosphatase 86 38 - 126 U/L   Total Bilirubin 0.4 0.0 - 1.2 mg/dL   GFR, Estimated >39 >39 mL/min    Comment: (NOTE) Calculated using the CKD-EPI Creatinine Equation (2021)    Anion gap 9 5 - 15    Comment: Performed at [facility], [address], [place], [place] [address]  Ethanol     Status: Abnormal   Collection Time: 08/16/23 10:43 AM  Result Value Ref Range   Alcohol, Ethyl (B) 56 (H) <15 mg/dL    Comment: (NOTE) For medical purposes only. Performed at [facility], [address], [place], [place] [address]   TSH     Status: None   Collection Time: 08/16/23 10:43 AM  Result Value Ref Range   TSH 1.466 0.350 - 4.500 uIU/mL    Comment: Performed by a 3rd Generation assay with a functional sensitivity of <=0.01 uIU/mL. Performed at [facility], [address], [place], [place] [address]   Lipid panel     Status: Abnormal   Collection Time: 08/16/23 10:43 AM  Result Value Ref Range   Cholesterol 114 0 - 200 mg/dL   Triglycerides 98 <849 mg/dL   HDL 40 (L) >59 mg/dL   Total CHOL/HDL Ratio 2.9 RATIO   VLDL 20 0 - 40 mg/dL   LDL Cholesterol 54 0 - 99 mg/dL    Comment:        Total Cholesterol/HDL:CHD Risk Coronary Heart Disease Risk Table                     Men   Women  1/2 Average Risk   3.4   3.3  Average Risk       5.0   4.4  2 X Average  Risk   9.6   7.1  3 X Average Risk  23.4   11.0        Use the calculated Patient Ratio above and the CHD Risk Table to determine the patient's CHD Risk.        ATP III CLASSIFICATION (LDL):  <100     mg/dL   Optimal  899-870  mg/dL   Near or Above                    Optimal  130-159  mg/dL   Borderline  839-810  mg/dL   High  >809     mg/dL   Very High Performed at [facility], [address] [place], [place] [address]   Magnesium      Status: None   Collection Time: 08/16/23 10:43 AM  Result Value Ref Range   Magnesium  1.7 1.7 - 2.4 mg/dL    Comment: Performed at [facility], [address], [place], [place] [address]    Blood Alcohol level:  Lab Results  Component Value Date   ETH 56 (H) 08/16/2023   ETH 223 (H) 05/02/2023    Metabolic Disorder Labs:  No results found for: HGBA1C, MPG No results found for: PROLACTIN Lab Results  Component Value Date   CHOL 114 08/16/2023   TRIG 98 08/16/2023   HDL 40 (L) 08/16/2023   CHOLHDL 2.9 08/16/2023   VLDL 20 08/16/2023   LDLCALC 54 08/16/2023   LDLCALC 91 11/20/2021    Current Medications: Current Facility-Administered Medications  Medication Dose Route Frequency Provider Last Rate Last Admin   acetaminophen  (TYLENOL ) tablet 650 mg  650 mg Oral Q6H PRN McCarty, Artie, MD       acetaminophen  (TYLENOL ) tablet 650 mg  650 mg Oral Q6H PRN Bobbitt, Shalon E, NP       alum & mag hydroxide-simeth (MAALOX/MYLANTA) 200-200-20 MG/5ML suspension 30 mL  30 mL Oral Q4H PRN McCarty, Artie, MD       alum & mag hydroxide-simeth (MAALOX/MYLANTA) 200-200-20 MG/5ML suspension 30 mL  30 mL Oral Q4H PRN Bobbitt, Shalon E, NP       ARIPiprazole  (ABILIFY ) tablet 5 mg  5 mg Oral Daily Bobbitt, Shalon E, NP       haloperidol  (HALDOL ) tablet 5 mg  5 mg Oral TID PRN McCarty, Artie, MD       And   diphenhydrAMINE  (BENADRYL ) capsule 50 mg  50 mg Oral TID PRN McCarty, Artie, MD       haloperidol  (HALDOL ) tablet 5 mg  5 mg Oral TID PRN Bobbitt, Shalon E, NP       And   diphenhydrAMINE  (BENADRYL ) capsule 50 mg  50 mg Oral TID PRN Bobbitt, Shalon E, NP       haloperidol  lactate (HALDOL ) injection 5 mg  5 mg Intramuscular TID PRN McCarty, Artie, MD       And   diphenhydrAMINE  (BENADRYL ) injection 50 mg  50 mg Intramuscular TID PRN McCarty, Artie, MD       And   LORazepam  (ATIVAN ) injection 2 mg  2 mg Intramuscular TID PRN McCarty,  Artie, MD       haloperidol  lactate (HALDOL ) injection 10 mg  10 mg Intramuscular TID PRN McCarty, Artie, MD       And   diphenhydrAMINE  (BENADRYL ) injection 50 mg  50 mg Intramuscular TID PRN McCarty, Artie, MD       And   LORazepam  (ATIVAN ) injection 2  mg  2 mg Intramuscular TID PRN McCarty, Artie, MD       haloperidol  lactate (HALDOL ) injection 5 mg  5 mg Intramuscular TID PRN Bobbitt, Shalon E, NP       And   diphenhydrAMINE  (BENADRYL ) injection 50 mg  50 mg Intramuscular TID PRN Bobbitt, Shalon E, NP       And   LORazepam  (ATIVAN ) injection 2 mg  2 mg Intramuscular TID PRN Bobbitt, Shalon E, NP       haloperidol  lactate (HALDOL ) injection 10 mg  10 mg Intramuscular TID PRN Bobbitt, Shalon E, NP       And   diphenhydrAMINE  (BENADRYL ) injection 50 mg  50 mg Intramuscular TID PRN Bobbitt, Shalon E, NP       And   LORazepam  (ATIVAN ) injection 2 mg  2 mg Intramuscular TID PRN Bobbitt, Shalon E, NP       hydrOXYzine  (ATARAX ) tablet 25 mg  25 mg Oral TID PRN McCarty, Artie, MD       hydrOXYzine  (ATARAX ) tablet 25 mg  25 mg Oral TID PRN Bobbitt, Shalon E, NP       hydrOXYzine  (ATARAX ) tablet 25 mg  25 mg Oral Q6H PRN Bobbitt, Shalon E, NP       loperamide  (IMODIUM ) capsule 2-4 mg  2-4 mg Oral PRN Bobbitt, Shalon E, NP       LORazepam  (ATIVAN ) tablet 1 mg  1 mg Oral Q6H PRN Bobbitt, Shalon E, NP       LORazepam  (ATIVAN ) tablet 1 mg  1 mg Oral TID Bobbitt, Shalon E, NP       Followed by   NOREEN ON 08/18/2023] LORazepam  (ATIVAN ) tablet 1 mg  1 mg Oral BID Bobbitt, Shalon E, NP       Followed by   NOREEN ON 08/19/2023] LORazepam  (ATIVAN ) tablet 1 mg  1 mg Oral Daily Bobbitt, Shalon E, NP       magnesium  hydroxide (MILK OF MAGNESIA) suspension 30 mL  30 mL Oral Daily PRN McCarty, Artie, MD       magnesium  hydroxide (MILK OF MAGNESIA) suspension 30 mL  30 mL Oral Daily PRN Bobbitt, Shalon E, NP       multivitamin with minerals tablet 1 tablet  1 tablet Oral Daily Bobbitt, Shalon E, NP        ondansetron  (ZOFRAN -ODT) disintegrating tablet 4 mg  4 mg Oral Q6H PRN Bobbitt, Shalon E, NP       thiamine  (Vitamin B-1) tablet 100 mg  100 mg Oral Daily Bobbitt, Shalon E, NP       thiamine  (VITAMIN B1) injection 100 mg  100 mg Intramuscular Once Bobbitt, Shalon E, NP       traZODone  (DESYREL ) tablet 50 mg  50 mg Oral QHS PRN McCarty, Artie, MD       traZODone  (DESYREL ) tablet 50 mg  50 mg Oral QHS PRN Bobbitt, Shalon E, NP       PTA Medications: No medications prior to admission.    AIMS:  ,  ,  , N/A ,  ,  ,    Musculoskeletal: Strength & Muscle Tone: within normal limits Gait & Station: normal Patient leans: N/A            Psychiatric Specialty Exam:  Presentation  General Appearance:  Casual  Eye Contact: Fair  Speech: Clear and Coherent; Slow  Speech Volume: Decreased  Handedness: Right   Mood and Affect  Mood: Depressed  Affect: Appropriate   Thought  Process  Thought Processes: Coherent  Duration of Psychotic Symptoms:N/A Past Diagnosis of Schizophrenia or Psychoactive disorder: No  Descriptions of Associations:Intact  Orientation:Full (Time, Place and Person)  Thought Content:WDL  Hallucinations:Hallucinations: None  Ideas of Reference:None  Suicidal Thoughts:Suicidal Thoughts: Yes, Active SI Active Intent and/or Plan: With Intent; With Plan  Homicidal Thoughts:Homicidal Thoughts: No   Sensorium  Memory: Immediate Good; Recent Good; Remote Good  Judgment: Poor  Insight: Poor   Executive Functions  Concentration: Fair  Attention Span: Fair  Recall: Fair  Fund of Knowledge: Fair  Language: Fair   Psychomotor Activity  Psychomotor Activity: Psychomotor Activity: Normal   Assets  Assets: Desire for Improvement; Physical Health; Housing; Communication Skills   Sleep  Sleep: Sleep: Fair  Estimated Sleeping Duration (Last 24 Hours): 7.75-8.50 hours   Physical Exam: Physical Exam Vitals and  nursing note reviewed.  Constitutional:      General: She is not in acute distress.    Appearance: She is not ill-appearing.  HENT:     Mouth/Throat:     Pharynx: Oropharynx is clear.  Cardiovascular:     Pulses: Normal pulses.     Comments: Blood Pressure 122/91 Pulmonary:     Effort: No respiratory distress.  Musculoskeletal:        General: Normal range of motion.     Cervical back: Normal range of motion.  Skin:    General: Skin is dry.  Neurological:     General: No focal deficit present.     Mental Status: She is alert and oriented to person, place, and time.    Review of Systems  Constitutional: Negative.   Respiratory: Negative.    Cardiovascular: Negative.   Gastrointestinal: Negative.   Genitourinary: Negative.   Skin: Negative.   Neurological: Negative.   Psychiatric/Behavioral:  Positive for depression, substance abuse and suicidal ideas. Negative for hallucinations and memory loss. The patient is nervous/anxious. The patient does not have insomnia.    Blood pressure (!) 122/91, pulse 88, temperature 99.1 F (37.3 C), resp. rate 20, height 5' 3 (1.6 m), weight 107 kg, unknown if currently breastfeeding. Body mass index is 41.81 kg/m.  Treatment Plan Summary: Daily contact with patient to assess and evaluate symptoms and progress in treatment and Medication management  Observation Level/Precautions:  15 minute checks  Laboratory:  Reviewed admission labs: UDS is unremarkable.  Urine pregnancy is negative.  BAL 56.  CBC with Differential/Platelet - RBC 3.74.  CMP - Total protein 6.3, Albumin 3.3, AST 14. Lipid panel - HDL 40.  New lab orders placed by attending psychiatrist: HIV, Vitamin D , Vitamin B12, RPR, Hemoglobin A1c, Folate  Psychotherapy: Group therapies  Medications:    Continue BH agitation protocol (see MAR) Continue hydroxyzine  25 mg 3 times daily as needed, anxiety Continue trazodone  50 mg bedtime as needed, sleep  # Alcohol use  disorder Discontinue Ativan  taper due to refusal Thiamine  100 mg daily Multivitamin with minerals 1 tablet daily Hydroxyzine  every 6 hours as needed, anxiety/agitation or CIWA < or = 10 Zofran  4 mg every 6 hours as needed, nausea, vomiting   As Needed Medications: Tylenol  650 mg every 6 hours as needed, mild pain Maalox 30 mg every 4 hours as needed, indigestion Milk of magnesia 30 mL daily as needed, mild constipation   Consultations: As needed  Discharge Concerns: Safety planning   Estimated LOS:  Other: N/A   Physician Treatment Plan for Primary Diagnosis: Suicidal ideation Long Term Goal(s): Improvement in symptoms so as ready for  discharge  Short Term Goals: Ability to identify changes in lifestyle to reduce recurrence of condition will improve, Ability to verbalize feelings will improve, Ability to disclose and discuss suicidal ideas, Ability to demonstrate self-control will improve, Ability to identify and develop effective coping behaviors will improve, Ability to maintain clinical measurements within normal limits will improve, Compliance with prescribed medications will improve, and Ability to identify triggers associated with substance abuse/mental health issues will improve  Physician Treatment Plan for Secondary Diagnosis: Principal Problem:   Suicidal ideation Active Problems:   MDD (major depressive disorder), recurrent episode, severe (HCC)  Long Term Goal(s): Improvement in symptoms so as ready for discharge  Short Term Goals: Ability to identify changes in lifestyle to reduce recurrence of condition will improve, Ability to verbalize feelings will improve, Ability to disclose and discuss suicidal ideas, Ability to demonstrate self-control will improve, Ability to identify and develop effective coping behaviors will improve, Ability to maintain clinical measurements within normal limits will improve, Compliance with prescribed medications will improve, and Ability to  identify triggers associated with substance abuse/mental health issues will improve  I certify that inpatient services furnished can reasonably be expected to improve the patient's condition.    Blair Chiquita Hint, NP 8/5/20259:39 AM
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
## 2023-08-17 NOTE — Progress Notes (Signed)
   08/16/23 2005  Psych Admission Type (Psych Patients Only)  Admission Status Voluntary  Psychosocial Assessment  Patient Complaints Anxiety;Hopelessness  Eye Contact Fair  Facial Expression Anxious;Flat  Affect Anxious;Depressed  Speech Soft  Interaction Cautious;Avoidant;Defensive;Guarded  Motor Activity Other (Comment) (WNL)  Appearance/Hygiene In scrubs  Behavior Characteristics Agitated;Anxious  Mood Anxious;Helpless;Preoccupied  Thought Process  Coherency Circumstantial  Content Preoccupation  Delusions None reported or observed  Perception WDL  Hallucination None reported or observed  Judgment Poor  Confusion None  Danger to Self  Current suicidal ideation? Verbalizes (I want to kill myself.)  Description of Suicide Plan ETOH/& Pills  Agreement Not to Harm Self Yes  Description of Agreement Notify Staff.  Danger to Others  Danger to Others None reported or observed

## 2023-08-17 NOTE — Progress Notes (Signed)
   08/17/23 2100  Psych Admission Type (Psych Patients Only)  Admission Status Voluntary  Psychosocial Assessment  Patient Complaints Anxiety  Eye Contact Fair  Facial Expression Anxious  Affect Anxious  Speech Soft;Logical/coherent  Interaction Assertive  Motor Activity Other (Comment) (WDL)  Appearance/Hygiene Unremarkable  Behavior Characteristics Cooperative;Calm;Anxious  Mood Anxious  Aggressive Behavior  Effect No apparent injury  Thought Process  Coherency WDL  Content WDL  Delusions None reported or observed  Perception WDL  Hallucination None reported or observed  Judgment Impaired  Confusion None  Danger to Self  Current suicidal ideation? Denies  Agreement Not to Harm Self Yes  Description of Agreement verbalized   (Sleep Hours) -6.75 (Any PRNs that were needed, meds refused, or side effects to meds)- none (Any disturbances and when (visitation, over night)-none (Concerns raised by the patient)- Pt stated she would like to continue going to [facility]. Pt stated she has some worries because of the events which led  to her admission. Pt stated she is concern of her being delayed in returning to [facility] upon discharging. Pt is insightful about care and stated she would like resources upon discharge to continue care outpatient. (SI/HI/AVH)-Denied

## 2023-08-17 NOTE — Group Note (Signed)
 LCSW Group Therapy Note   Group Date: 08/17/2023 Start Time: 1100 End Time: 1200     Participation:  did not attend  Type of Therapy:  Group Therapy    Topic:  Money Matters: Ecologist, Confidence and Peace of Mind   Objective: To help participants understand the impact of financial stability on well-being through the lens of Maslow's Hierarchy of Needs and develop practical strategies for budgeting, saving, and debt repayment.   Goals: Increase awareness of spending habits and financial priorities, recognizing how money supports basic needs, security, and relationships. Develop simple budgeting and saving strategies to enhance stability and peace of mind.  Reduce financial stress by creating a realistic debt repayment plan, supporting long-term confidence and well-being.   Summary:  Participants explored how financial stability connects to basic needs, relationships, and self-esteem using Maslow's Hierarchy. They discussed budgeting, saving, and debt repayment strategies, identifying small, manageable changes. Through interactive discussion and self-reflection, they gained insight into their financial habits and created personal action steps for improvement.   Therapeutic Modalities Used: Elements of Cognitive Behavioral Therapy (CBT) - Addressing financial stress and thought patterns. Psychoeducation - Engineer, agricultural. Elements of Motivational Interviewing (MI) - Encouraging realistic, achievable changes. Group Support - Reducing shame and stress through shared experiences.   Amazin Pincock O Shama Monfils, LCSWA 08/17/2023  12:50 PM

## 2023-08-17 NOTE — BH Assessment (Signed)
(  Sleep Hours) - 8.5 (Any PRNs that were needed, meds refused, or side effects to meds)- None. Pt refused Ativan  1 mg protocol x2. (Any disturbances and when (visitation, over night)- None (Concerns raised by the patient)- None (SI/HI/AVH)- Passive SI. Denies: HI; A/V/H

## 2023-08-17 NOTE — Group Note (Signed)
 Recreation Therapy Group Note   Group Topic:Animal Assisted Therapy   Group Date: 08/17/2023 Start Time: 9049 End Time: 1028 Facilitators: Yahshua Thibault-McCall, LRT,CTRS Location: [address] Dayroom   Animal-Assisted Activity (AAA) Program Checklist/Progress Notes Patient Eligibility Criteria Checklist & Daily Group note for Rec Tx Intervention  AAA/T Program Assumption of Risk Form signed by Patient/ or Parent Legal Guardian Yes  Patient understands his/her participation is voluntary Yes  Behavioral Response:    Education: Charity fundraiser, Appropriate Animal Interaction   Education Outcome: Acknowledges education.    Affect/Mood: N/A   Participation Level: Did not attend    Clinical Observations/Individualized Feedback:      Plan: Continue to engage patient in RT group sessions 2-3x/week.   Chanell Nadeau-McCall, LRT,CTRS 08/17/2023 1:15 PM

## 2023-08-17 NOTE — Plan of Care (Signed)
 Patient denies any depression or anxiety today voicing her depression and anxiety is only situational. She denies SI/HI/AVH and reported no pain. Patient began attending group today voicing she was motivated to learn coping skills.   Problem: Education: Goal: Knowledge of [place] [facility] Education information/materials will improve Outcome: Completed/Met Goal: Emotional status will improve Outcome: Progressing Goal: Mental status will improve Outcome: Progressing Goal: Verbalization of understanding the information provided will improve Outcome: Progressing   Problem: Activity: Goal: Interest or engagement in activities will improve Outcome: Progressing Goal: Sleeping patterns will improve Outcome: Progressing
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
## 2023-08-18 ENCOUNTER — Encounter (HOSPITAL_COMMUNITY): Payer: Self-pay

## 2023-08-18 DIAGNOSIS — F332 Major depressive disorder, recurrent severe without psychotic features: Principal | ICD-10-CM

## 2023-08-18 DIAGNOSIS — R45851 Suicidal ideations: Secondary | ICD-10-CM

## 2023-08-18 LAB — RPR: RPR Ser Ql: NONREACTIVE

## 2023-08-18 LAB — HEMOGLOBIN A1C
Hgb A1c MFr Bld: 5.5 % (ref 4.8–5.6)
Mean Plasma Glucose: 111 mg/dL

## 2023-08-18 NOTE — BH IP Treatment Plan (Signed)
 Interdisciplinary Treatment and Diagnostic Plan Update  08/18/2023 Time of Session: 11:05AM Veronica Le MRN: [id number]  Principal Diagnosis: Suicidal ideation  Secondary Diagnoses: Principal Problem:   Suicidal ideation Active Problems:   MDD (major depressive disorder), recurrent episode, severe (HCC)   Current Medications:  Current Facility-Administered Medications  Medication Dose Route Frequency Provider Last Rate Last Admin   acetaminophen  (TYLENOL ) tablet 650 mg  650 mg Oral Q6H PRN Bobbitt, Shalon E, NP       alum & mag hydroxide-simeth (MAALOX/MYLANTA) 200-200-20 MG/5ML suspension 30 mL  30 mL Oral Q4H PRN Bobbitt, Shalon E, NP       ARIPiprazole  (ABILIFY ) tablet 5 mg  5 mg Oral Daily Bobbitt, Shalon E, NP       haloperidol  (HALDOL ) tablet 5 mg  5 mg Oral TID PRN Bobbitt, Shalon E, NP       And   diphenhydrAMINE  (BENADRYL ) capsule 50 mg  50 mg Oral TID PRN Bobbitt, Shalon E, NP       haloperidol  lactate (HALDOL ) injection 5 mg  5 mg Intramuscular TID PRN Bobbitt, Shalon E, NP       And   diphenhydrAMINE  (BENADRYL ) injection 50 mg  50 mg Intramuscular TID PRN Bobbitt, Shalon E, NP       And   LORazepam  (ATIVAN ) injection 2 mg  2 mg Intramuscular TID PRN Bobbitt, Shalon E, NP       haloperidol  lactate (HALDOL ) injection 10 mg  10 mg Intramuscular TID PRN Bobbitt, Shalon E, NP       And   diphenhydrAMINE  (BENADRYL ) injection 50 mg  50 mg Intramuscular TID PRN Bobbitt, Shalon E, NP       And   LORazepam  (ATIVAN ) injection 2 mg  2 mg Intramuscular TID PRN Bobbitt, Shalon E, NP       hydrOXYzine  (ATARAX ) tablet 25 mg  25 mg Oral Q6H PRN Bobbitt, Shalon E, NP       loperamide  (IMODIUM ) capsule 2-4 mg  2-4 mg Oral PRN Bobbitt, Shalon E, NP       LORazepam  (ATIVAN ) tablet 1 mg  1 mg Oral Q6H PRN Bobbitt, Shalon E, NP       magnesium  hydroxide (MILK OF MAGNESIA) suspension 30 mL  30 mL Oral Daily PRN Bobbitt, Shalon E, NP       multivitamin with minerals tablet 1 tablet  1  tablet Oral Daily Bobbitt, Shalon E, NP   1 tablet at 08/18/23 0900   ondansetron  (ZOFRAN -ODT) disintegrating tablet 4 mg  4 mg Oral Q6H PRN Bobbitt, Shalon E, NP       thiamine  (Vitamin B-1) tablet 100 mg  100 mg Oral Daily Bobbitt, Shalon E, NP   100 mg at 08/18/23 0900   traZODone  (DESYREL ) tablet 50 mg  50 mg Oral QHS PRN Bobbitt, Shalon E, NP       PTA Medications: No medications prior to admission.    Patient Stressors: Financial difficulties   Substance abuse    Patient Strengths: Ability for insight  Average or above average intelligence  Communication skills  General fund of knowledge   Treatment Modalities: Medication Management, Group therapy, Case management,  1 to 1 session with clinician, Psychoeducation, Recreational therapy.   Physician Treatment Plan for Primary Diagnosis: Suicidal ideation Long Term Goal(s): Improvement in symptoms so as ready for discharge   Short Term Goals: Ability to identify changes in lifestyle to reduce recurrence of condition will improve Ability to verbalize feelings will improve Ability to disclose  and discuss suicidal ideas Ability to demonstrate self-control will improve Ability to identify and develop effective coping behaviors will improve Ability to maintain clinical measurements within normal limits will improve Compliance with prescribed medications will improve Ability to identify triggers associated with substance abuse/mental health issues will improve  Medication Management: Evaluate patient's response, side effects, and tolerance of medication regimen.  Therapeutic Interventions: 1 to 1 sessions, Unit Group sessions and Medication administration.  Evaluation of Outcomes: Progressing  Physician Treatment Plan for Secondary Diagnosis: Principal Problem:   Suicidal ideation Active Problems:   MDD (major depressive disorder), recurrent episode, severe (HCC)  Long Term Goal(s): Improvement in symptoms so as ready for  discharge   Short Term Goals: Ability to identify changes in lifestyle to reduce recurrence of condition will improve Ability to verbalize feelings will improve Ability to disclose and discuss suicidal ideas Ability to demonstrate self-control will improve Ability to identify and develop effective coping behaviors will improve Ability to maintain clinical measurements within normal limits will improve Compliance with prescribed medications will improve Ability to identify triggers associated with substance abuse/mental health issues will improve     Medication Management: Evaluate patient's response, side effects, and tolerance of medication regimen.  Therapeutic Interventions: 1 to 1 sessions, Unit Group sessions and Medication administration.  Evaluation of Outcomes: Progressing   RN Treatment Plan for Primary Diagnosis: Suicidal ideation Long Term Goal(s): Knowledge of disease and therapeutic regimen to maintain health will improve  Short Term Goals: Ability to verbalize frustration and anger appropriately will improve, Ability to demonstrate self-control, and Ability to verbalize feelings will improve  Medication Management: RN will administer medications as ordered by provider, will assess and evaluate patient's response and provide education to patient for prescribed medication. RN will report any adverse and/or side effects to prescribing provider.  Therapeutic Interventions: 1 on 1 counseling sessions, Psychoeducation, Medication administration, Evaluate responses to treatment, Monitor vital signs and CBGs as ordered, Perform/monitor CIWA, COWS, AIMS and Fall Risk screenings as ordered, Perform wound care treatments as ordered.  Evaluation of Outcomes: Progressing   LCSW Treatment Plan for Primary Diagnosis: Suicidal ideation Long Term Goal(s): Safe transition to appropriate next level of care at discharge, Engage patient in therapeutic group addressing interpersonal  concerns.  Short Term Goals: Engage patient in aftercare planning with referrals and resources, Increase ability to appropriately verbalize feelings, Identify triggers associated with mental health/substance abuse issues, and Increase skills for wellness and recovery  Therapeutic Interventions: Assess for all discharge needs, 1 to 1 time with Social worker, Explore available resources and support systems, Assess for adequacy in community support network, Educate family and significant other(s) on suicide prevention, Complete Psychosocial Assessment, Interpersonal group therapy.  Evaluation of Outcomes: Progressing   Progress in Treatment: Attending groups: No. Participating in groups: No. Taking medication as prescribed: No. Toleration medication: No. Family/Significant other contact made: No, will contact:  Ms. Moore(Godmother) [phone] or Spirit, (Godsister) [phone] or Franky, (God brother) [phone] Patient understands diagnosis: Yes. Discussing patient identified problems/goals with staff: Yes. Medical problems stabilized or resolved: Yes. Denies suicidal/homicidal ideation: Yes. Issues/concerns per patient self-inventory: No.  New problem(s) identified: No, Describe:  None  New Short Term/Long Term Goal(s): medication management for mood stabilization; elimination of SI thoughts; development of comprehensive mental wellness/sobriety plan    Patient Goals: Get help and discharge  Discharge Plan or Barriers: Patient recently admitted. CSW will continue to follow and assess for appropriate referrals and possible discharge planning.    Reason for Continuation of Hospitalization:  Depression Medication stabilization Suicidal ideation  Estimated Length of Stay: 1-2 days  Last 3 [place] Suicide Severity Risk Score: Flowsheet Row [facility] (Current[facility] Most recent reading at 08/16/2023 12:45 PM ED from 08/16/2023  in [facility] Most recent reading at 08/16/2023  5:31 AM ED from 06/25/2023 in [facility] Emergency Department at [facility] Most recent reading at 06/25/2023  3:41 PM  C-SSRS RISK CATEGORY High Risk Error: Q3, 4, or 5 should not be populated when Q2 is No No Risk    Last PHQ 2/9 Scores:    11/05/2021    7:03 AM 10/30/2021    6:56 AM  Depression screen PHQ 2/9  Decreased Interest 1 1  Down, Depressed, Hopeless 2 2  PHQ - 2 Score 3 3  Altered sleeping 1 1  Tired, decreased energy 1 2  Change in appetite 1 2  Feeling bad or failure about yourself  2 3  Trouble concentrating 0 2  Moving slowly or fidgety/restless 0 2  Suicidal thoughts 0 0  PHQ-9 Score 8 15  Difficult doing work/chores Somewhat difficult Very difficult    Scribe for Treatment Team: Orelia Brandstetter  Nunez-Uva, KEN 08/18/2023 1:04 PM

## 2023-08-18 NOTE — Progress Notes (Signed)
   08/18/23 0900  Psych Admission Type (Psych Patients Only)  Admission Status Voluntary  Psychosocial Assessment  Patient Complaints Other (Comment);Anxiety (states did not sleep good, feels fantastic and wants to go home. anxiety about d/c refused PRN med)  Eye Contact Fair  Facial Expression Anxious  Affect Anxious  Speech Logical/coherent  Interaction Assertive  Motor Activity Other (Comment) (WNL)  Appearance/Hygiene Unremarkable;In scrubs  Behavior Characteristics Anxious  Mood Anxious  Thought Process  Coherency WDL  Content WDL  Delusions None reported or observed  Perception WDL  Hallucination None reported or observed  Judgment Impaired  Confusion WDL  Danger to Self  Current suicidal ideation? Denies  Agreement Not to Harm Self Yes  Description of Agreement verbal

## 2023-08-18 NOTE — Progress Notes (Signed)
   08/18/23 2100  Psych Admission Type (Psych Patients Only)  Admission Status Voluntary  Psychosocial Assessment  Patient Complaints Anxiety  Eye Contact Fair  Facial Expression Anxious  Affect Anxious  Speech Logical/coherent  Interaction Assertive  Motor Activity Other (Comment) (WDL)  Appearance/Hygiene Unremarkable  Behavior Characteristics Anxious  Mood Anxious  Aggressive Behavior  Effect No apparent injury  Thought Process  Coherency WDL  Content WDL  Delusions None reported or observed  Perception WDL  Hallucination None reported or observed  Judgment Impaired  Confusion WDL  Danger to Self  Current suicidal ideation? Denies   (Sleep Hours) -8.5 (Any PRNs that were needed, meds refused, or side effects to meds)- none (Any disturbances and when (visitation, over night)-none (Concerns raised by the patient)- none (SI/HI/AVH)-denied

## 2023-08-18 NOTE — Plan of Care (Signed)
   Problem: Education: Goal: Emotional status will improve Outcome: Progressing

## 2023-08-18 NOTE — Group Note (Signed)
 Date:  08/18/2023 Time:  10:02 AM  Group Topic/Focus:  Goals Group:   The focus of this group is to help patients establish daily goals to achieve during treatment and discuss how the patient can incorporate goal setting into their daily lives to aide in recovery.    Participation Level:  Did Not Attend  Additional Comments:   Group was announced pt chose not to attend.  Adriana GORMAN Blush 08/18/2023, 10:02 AM

## 2023-08-18 NOTE — Plan of Care (Signed)
  Problem: Education: Goal: Emotional status will improve Outcome: Progressing Goal: Mental status will improve Outcome: Progressing Goal: Verbalization of understanding the information provided will improve Outcome: Progressing   Problem: Activity: Goal: Interest or engagement in activities will improve Outcome: Progressing Goal: Sleeping patterns will improve Outcome: Progressing   Problem: Coping: Goal: Ability to verbalize frustrations and anger appropriately will improve Outcome: Progressing Goal: Ability to demonstrate self-control will improve Outcome: Progressing   Problem: Health Behavior/Discharge Planning: Goal: Identification of resources available to assist in meeting health care needs will improve Outcome: Progressing Goal: Compliance with treatment plan for underlying cause of condition will improve Outcome: Progressing   Problem: Physical Regulation: Goal: Ability to maintain clinical measurements within normal limits will improve Outcome: Progressing   Problem: Safety: Goal: Periods of time without injury will increase Outcome: Progressing

## 2023-08-18 NOTE — Progress Notes (Signed)
 Adult Psychoeducational Group Note  Date:  08/18/2023 Time:  7:28 AM  Group Topic/Focus:  Wrap-Up Group:   The focus of this group is to help patients review their daily goal of treatment and discuss progress on daily workbooks.  Participation Level:  Active  Participation Quality:  Appropriate  Affect:  Appropriate  Cognitive:  Appropriate  Insight: Appropriate  Engagement in Group:  Engaged  Modes of Intervention:  Discussion  Additional Comments:  Sherlyn said she has a lot going on. She did not want to talk.  Lang Drilling Long 08/18/2023, 7:28 AM

## 2023-08-18 NOTE — [facility] Group Notes (Signed)
 Spirituality Group   Focus of discussion: Gratitude and Strength Awareness   Process: Following theoretical framework of group therapy of Irvin Yalom and further informed by Rogerian and Relational Cultural Theory approaches, participants invited to name:   Sources of gratitude (internal>external)   Articulate gratitude for self   Name a personal strength/gift/skill   Locate points of resonance among group members/engage the here and now   Conclude with grounding/breathwork   Observations: Earnestine started off the group discussion since the topic of gratitude & choice were part of her recent journaling. She reflected meaningfully and with vulnerability how she had observed her mood and gradually shifted to a more positive outlook. This fostered a great deal of resonance with peers and built trust within the group, leading others to share openly.  Keylin Ferryman L. Fredrica, M.Div [phone]

## 2023-08-18 NOTE — Progress Notes (Incomplete)
 [facility] MD Progress Note  08/18/2023 9:39 AM Veronica Le  MRN:  [id number] Reason for Admission:  Veronica Le is a 48 year old female with a psychiatric history significant of alcohol use disorder, GAD, and adjustment disorder.  She was admitted to the [facility] following voluntary presentation to [facility], reporting worsening depression and suicidal ideation, including a plan to overdose.  These symptoms reportedly emerged in the context of a recent relapse into alcohol use.   Chart reviewed.  Patient discussed with the multidisciplinary team.  Daily Evaluation: On assessment today, Veronica Le reports that her mood is euthymic, improved since admission, and stable. She denies feeling down, depressed, or sad. Reports that anxiety symptoms are at manageable level. Reports sleep and appetite is stable. Concentration is without complaint. Reports energy level is adequate. She denies having any suicidal thoughts, intent and plan. She denies having any homicidal ideation and psychotic symptoms. She reports a desire to be discharged and states she does not wish to go to [facility] for a 30-day program.  States that she has an alternate plan to reach out to her supportive system.  Reports she believes that the alcohol has metabolized from her system and attributes her recent symptoms to situational depression.  States her intention is to use coping tools such as spending time in nature, painting, and drawing.  She also stated she is not interested in initiating any medication for mood stabilization or cravings at this time.  However, states that she is interested in engaging in therapy in the community.  She denies experiencing any withdrawal symptoms.  Discussed discharge planning and emphasized attendance at follow-up appointments to prevent symptom exacerbation.   We discussed psychosocial stressors including unresolved past trauma, financial strain,  custody-related stress, and recent emotional upheaval, such as being disowned by her mother. Discussed with the patient the critical importance of complete cessation of alcohol and other substances to support recovery, highlighting that abstaining can lead to improve mood stability, enhance cognitive function, better sleep, and lower anxiety.  Encouraged ongoing use of coping strategies and support system.   Overall, she was alert, oriented, and engaged appropriately during the interview.  No signs of acute distress, withdrawal, or mood instability were observed.  No mania or psychotic features observed. The case was discussed with the attending psychiatrist.  No psychiatric medication to be initiated at this time per patient preference.  Discharge is anticipated on Thursday, August 7.  She is interested in a referral for therapy in the community.  Social work to Psychologist, counselling.   Principal Problem: Suicidal ideation Diagnosis: Principal Problem:   Suicidal ideation Active Problems:   MDD (major depressive disorder), recurrent episode, severe (HCC)  Total Time spent with patient: 45 minutes  Past Psychiatric History: See H&P  Past Medical History:  Past Medical History:  Diagnosis Date   Adjustment disorder    Alcohol abuse    Medical history non-contributory    Panic attack    Substance induced mood disorder (HCC)     Past Surgical History:  Procedure Laterality Date   NO PAST SURGERIES     ORIF ANKLE FRACTURE Left 11/01/2019   Procedure: OPEN REDUCTION INTERNAL FIXATION (ORIF) LEFT ANKLE FRACTURE;  Surgeon: Addie Cordella Hamilton, MD;  Location: [facility] OR;  Service: Orthopedics;  Laterality: Left;   Family History: History reviewed. No pertinent family history. Family Psychiatric  History: See H&P Social History:  Social History   Substance and Sexual Activity  Alcohol Use Yes  Social History   Substance and Sexual Activity  Drug Use Not Currently   Types:  Marijuana    Social History   Socioeconomic History   Marital status: Divorced    Spouse name: Not on file   Number of children: Not on file   Years of education: Not on file   Highest education level: Not on file  Occupational History   Not on file  Tobacco Use   Smoking status: Some Days    Types: Cigarettes   Smokeless tobacco: Never  Vaping Use   Vaping status: Never Used  Substance and Sexual Activity   Alcohol use: Yes   Drug use: Not Currently    Types: Marijuana   Sexual activity: Yes    Birth control/protection: None  Other Topics Concern   Not on file  Social History Narrative   Not on file   Social Drivers of Health   Financial Resource Strain: Not on file  Food Insecurity: No Food Insecurity (08/16/2023)   Hunger Vital Sign    Worried About Running Out of Food in the Last Year: Never true    Ran Out of Food in the Last Year: Never true  Transportation Needs: No Transportation Needs (08/16/2023)   PRAPARE - Administrator, Civil Service (Medical): No    Lack of Transportation (Non-Medical): No  Physical Activity: Not on file  Stress: Not on file  Social Connections: Unknown (05/30/2021)   Received from [facility]   Social Network    Social Network: Not on file   Additional Social History:                         Sleep: Fair Estimated Sleeping Duration (Last 24 Hours): 6.25-6.75 hours  Appetite:  Fair  Current Medications: Current Facility-Administered Medications  Medication Dose Route Frequency Provider Last Rate Last Admin   acetaminophen  (TYLENOL ) tablet 650 mg  650 mg Oral Q6H PRN Bobbitt, Shalon E, NP       alum & mag hydroxide-simeth (MAALOX/MYLANTA) 200-200-20 MG/5ML suspension 30 mL  30 mL Oral Q4H PRN Bobbitt, Shalon E, NP       ARIPiprazole  (ABILIFY ) tablet 5 mg  5 mg Oral Daily Bobbitt, Shalon E, NP       haloperidol  (HALDOL ) tablet 5 mg  5 mg Oral TID PRN Bobbitt, Shalon E, NP       And   diphenhydrAMINE   (BENADRYL ) capsule 50 mg  50 mg Oral TID PRN Bobbitt, Shalon E, NP       haloperidol  lactate (HALDOL ) injection 5 mg  5 mg Intramuscular TID PRN Bobbitt, Shalon E, NP       And   diphenhydrAMINE  (BENADRYL ) injection 50 mg  50 mg Intramuscular TID PRN Bobbitt, Shalon E, NP       And   LORazepam  (ATIVAN ) injection 2 mg  2 mg Intramuscular TID PRN Bobbitt, Shalon E, NP       haloperidol  lactate (HALDOL ) injection 10 mg  10 mg Intramuscular TID PRN Bobbitt, Shalon E, NP       And   diphenhydrAMINE  (BENADRYL ) injection 50 mg  50 mg Intramuscular TID PRN Bobbitt, Shalon E, NP       And   LORazepam  (ATIVAN ) injection 2 mg  2 mg Intramuscular TID PRN Bobbitt, Shalon E, NP       hydrOXYzine  (ATARAX ) tablet 25 mg  25 mg Oral Q6H PRN Bobbitt, Shalon E, NP  loperamide  (IMODIUM ) capsule 2-4 mg  2-4 mg Oral PRN Bobbitt, Shalon E, NP       LORazepam  (ATIVAN ) tablet 1 mg  1 mg Oral Q6H PRN Bobbitt, Shalon E, NP       magnesium  hydroxide (MILK OF MAGNESIA) suspension 30 mL  30 mL Oral Daily PRN Bobbitt, Shalon E, NP       multivitamin with minerals tablet 1 tablet  1 tablet Oral Daily Bobbitt, Shalon E, NP   1 tablet at 08/18/23 0900   ondansetron  (ZOFRAN -ODT) disintegrating tablet 4 mg  4 mg Oral Q6H PRN Bobbitt, Shalon E, NP       thiamine  (Vitamin B-1) tablet 100 mg  100 mg Oral Daily Bobbitt, Shalon E, NP   100 mg at 08/18/23 0900   traZODone  (DESYREL ) tablet 50 mg  50 mg Oral QHS PRN Bobbitt, Shalon E, NP        Lab Results:  Results for orders placed or performed during the hospital encounter of 08/16/23 (from the past 48 hours)  Folate     Status: None   Collection Time: 08/17/23  6:47 PM  Result Value Ref Range   Folate 10.3 >5.9 ng/mL    Comment: Performed at [facility], [address], [place], [place] [address]  Hemoglobin A1c     Status: None   Collection Time: 08/17/23  6:47 PM  Result Value Ref Range   Hgb A1c MFr Bld 5.5 4.8 - 5.6 %    Comment: (NOTE)          Prediabetes: 5.7 - 6.4         Diabetes: >6.4         Glycemic control for adults with diabetes: <7.0    Mean Plasma Glucose 111 mg/dL    Comment: (NOTE) Performed At: [facility] [address] [place], [place] [id number] Jennette Shorter MD Ey:[id number]   RPR     Status: None   Collection Time: 08/17/23  6:47 PM  Result Value Ref Range   RPR Ser Ql NON REACTIVE NON REACTIVE    Comment: Performed at [facility], [address] [place], [place] [address]  Vitamin B12     Status: None   Collection Time: 08/17/23  6:47 PM  Result Value Ref Range   Vitamin B-12 325 180 - 914 pg/mL    Comment: (NOTE) This assay is not validated for testing neonatal or myeloproliferative syndrome specimens for Vitamin B12 levels. Performed at [facility], [address] [place], [place] [address]   VITAMIN D  25 Hydroxy (Vit-D Deficiency, Fractures)     Status: None   Collection Time: 08/17/23  6:47 PM  Result Value Ref Range   Vit D, 25-Hydroxy 33.84 30 - 100 ng/mL    Comment: (NOTE) Vitamin D  deficiency has been defined by the Institute of Medicine  and an Endocrine Society practice guideline as a level of serum 25-OH  vitamin D  less than 20 ng/mL (1,2). The Endocrine Society went on to  further define vitamin D  insufficiency as a level between 21 and 29  ng/mL (2).  1. IOM (Institute of Medicine). 2010. Dietary reference intakes for  calcium and D. [place]: [employer]. 2. Holick MF, Binkley [place], Bischoff-Ferrari HA, et al. Evaluation,  treatment, and prevention of vitamin D  deficiency: an Endocrine  Society clinical practice guideline, JCEM. 2011 Jul; 96(7): 1911-30.  Performed at [facility], [address], [place], [place] [address]  HIV Antibody (routine testing w rflx)     Status: None   Collection Time: 08/17/23  6:47 PM  Result Value Ref Range   HIV Screen 4th Generation wRfx Non Reactive Non Reactive    Comment:  Performed at [facility], [address] [place], [place] [address]    Blood Alcohol level:  Lab Results  Component Value Date   ETH 56 (H) 08/16/2023   ETH 223 (H) 05/02/2023    Metabolic Disorder Labs: Lab Results  Component Value Date   HGBA1C 5.5 08/17/2023   MPG 111 08/17/2023   No results found for: PROLACTIN Lab Results  Component Value Date   CHOL 114 08/16/2023   TRIG 98 08/16/2023   HDL 40 (L) 08/16/2023   CHOLHDL 2.9 08/16/2023   VLDL 20 08/16/2023   LDLCALC 54 08/16/2023   LDLCALC 91 11/20/2021    Physical Findings: AIMS:  ,  , N/A ,  ,  ,  ,   CIWA:  CIWA-Ar Total: 0 COWS:   N/A  Musculoskeletal: Strength & Muscle Tone: within normal limits Gait & Station: normal Patient leans: N/A  Psychiatric Specialty Exam:  Presentation  General Appearance:  Casual  Eye Contact: Fair  Speech: Clear and Coherent; Slow  Speech Volume: Decreased  Handedness: Right   Mood and Affect  Mood: Depressed  Affect: Appropriate   Thought Process  Thought Processes: Coherent  Descriptions of Associations:Intact  Orientation:Full (Time, Place and Person)  Thought Content:WDL  History of Schizophrenia/Schizoaffective disorder:No  Duration of Psychotic Symptoms:No data recorded Hallucinations:No data recorded Ideas of Reference:None  Suicidal Thoughts:No data recorded Homicidal Thoughts:No data recorded  Sensorium  Memory: Immediate Good; Recent Good; Remote Good  Judgment: Poor  Insight: Poor   Executive Functions  Concentration: Fair  Attention Span: Fair  Recall: Fair  Fund of Knowledge: Fair  Language: Fair   Psychomotor Activity  Psychomotor Activity:No data recorded  Assets  Assets: Desire for Improvement; Physical Health; Housing; Communication Skills   Sleep  Sleep:No data recorded   Physical Exam: Physical Exam Vitals and nursing note reviewed.  Constitutional:      General: She is not  in acute distress.    Appearance: She is obese. She is not ill-appearing.  HENT:     Nose: Nose normal.     Mouth/Throat:     Pharynx: Oropharynx is clear.  Cardiovascular:     Pulses: Normal pulses.     Comments: Blood Pressure 142/86 Pulmonary:     Effort: No respiratory distress.  Musculoskeletal:        General: Normal range of motion.     Cervical back: Normal range of motion.  Skin:    General: Skin is dry.  Neurological:     General: No focal deficit present.     Mental Status: She is alert and oriented to person, place, and time.    Review of Systems  Constitutional: Negative.   HENT: Negative.    Respiratory: Negative.    Cardiovascular: Negative.   Gastrointestinal: Negative.   Genitourinary: Negative.   Skin: Negative.   Neurological: Negative.   Psychiatric/Behavioral:  Positive for depression, substance abuse and suicidal ideas. Negative for hallucinations and memory loss. The patient is nervous/anxious. The patient does not have insomnia.    Blood pressure (!) 142/86, pulse 88, temperature 99.1 F (37.3 C), resp. rate 20, height 5' 3 (1.6 m), weight 107 kg, unknown if currently breastfeeding. Body mass index is 41.81 kg/m.   Treatment Plan Summary:  Daily contact with patient to assess and evaluate symptoms and progress in treatment and Medication management  8/6: Patient appears to be stabilizing and demonstrating insight into recent emotional changes.  She attributes symptoms to situational factors and is currently declining pharmacologic interventions but is interested in engaging in community based therapy.  No signs withdrawal reported or observed.  Continue focus on nonpharmacologic coping strategies.   Observation Level/Precautions:  15 minute checks  Laboratory:  Reviewed admission labs: UDS is unremarkable.  Urine pregnancy is negative.  BAL 56.  CBC with Differential/Platelet - RBC 3.74.  CMP - Total protein 6.3, Albumin 3.3, AST 14. Lipid panel -  HDL 40.    Psychotherapy: Group therapies  Medications:     No psychiatric medication to be initiated at this time per patient preference.  Continue BH agitation protocol (see MAR) Continue hydroxyzine  25 mg 3 times daily as needed, anxiety Continue trazodone  50 mg bedtime as needed, sleep   # Alcohol use disorder CIWA Assessment  Thiamine  100 mg daily Multivitamin with minerals 1 tablet daily Hydroxyzine  every 6 hours as needed, anxiety/agitation or CIWA < or = 10 Zofran  4 mg every 6 hours as needed, nausea, vomiting     As Needed Medications: Tylenol  650 mg every 6 hours as needed, mild pain Maalox 30 mg every 4 hours as needed, indigestion Milk of magnesia 30 mL daily as needed, mild constipation    Consultations: As needed  Discharge Concerns: Safety planning   Estimated LOS:  Other: N/A    Physician Treatment Plan for Primary Diagnosis: Suicidal ideation Long Term Goal(s): Improvement in symptoms so as ready for discharge   Short Term Goals: Ability to identify changes in lifestyle to reduce recurrence of condition will improve, Ability to verbalize feelings will improve, Ability to disclose and discuss suicidal ideas, Ability to demonstrate self-control will improve, Ability to identify and develop effective coping behaviors will improve, Ability to maintain clinical measurements within normal limits will improve, Compliance with prescribed medications will improve, and Ability to identify triggers associated with substance abuse/mental health issues will improve   Physician Treatment Plan for Secondary Diagnosis: Principal Problem:   Suicidal ideation Active Problems:   MDD (major depressive disorder), recurrent episode, severe (HCC)   Long Term Goal(s): Improvement in symptoms so as ready for discharge   Short Term Goals: Ability to identify changes in lifestyle to reduce recurrence of condition will improve, Ability to verbalize feelings will improve, Ability to  disclose and discuss suicidal ideas, Ability to demonstrate self-control will improve, Ability to identify and develop effective coping behaviors will improve, Ability to maintain clinical measurements within normal limits will improve, Compliance with prescribed medications will improve, and Ability to identify triggers associated with substance abuse/mental health issues will improve   I certify that inpatient services furnished can reasonably be expected to improve the patient's condition.       Blair Chiquita Hint, NP 08/18/2023, 9:39 AM

## 2023-08-19 ENCOUNTER — Other Ambulatory Visit (HOSPITAL_COMMUNITY): Payer: Self-pay

## 2023-08-19 MED ORDER — TRAZODONE HCL 50 MG PO TABS
50.0000 mg | ORAL_TABLET | Freq: Every evening | ORAL | 0 refills | Status: AC | PRN
  Filled 2023-08-19: qty 30, 30d supply, fill #0

## 2023-08-19 MED ORDER — ARIPIPRAZOLE 5 MG PO TABS
5.0000 mg | ORAL_TABLET | Freq: Every day | ORAL | 0 refills | Status: AC
  Filled 2023-08-19: qty 30, 30d supply, fill #0

## 2023-08-19 MED ORDER — HYDROXYZINE HCL 25 MG PO TABS
25.0000 mg | ORAL_TABLET | Freq: Four times a day (QID) | ORAL | 0 refills | Status: AC | PRN
  Filled 2023-08-19: qty 75, 19d supply, fill #0

## 2023-08-19 NOTE — Discharge Summary (Signed)
 Physician Discharge Summary Note  Patient:  Veronica Le is an 48 y.o., female  MRN:  [id number]  DOB:  [birth year]  Patient phone:  [phone] (home)   Patient address:   [address] [place] [address],   Total Time spent with patient: 45 minutes  Date of Admission:  08/16/2023  Date of Discharge: 08-19-23.  Reason for Admission: Worsening depression and suicidal ideation, including a plan to overdose.   Principal Problem: Suicidal ideation  Discharge Diagnoses: Principal Problem:   Suicidal ideation Active Problems:   MDD (major depressive disorder), recurrent episode, severe (HCC)  Past Psychiatric History: Major depressive disorder.  Past Medical History:  Past Medical History:  Diagnosis Date   Adjustment disorder    Alcohol abuse    Medical history non-contributory    Panic attack    Substance induced mood disorder (HCC)     Past Surgical History:  Procedure Laterality Date   NO PAST SURGERIES     ORIF ANKLE FRACTURE Left 11/01/2019   Procedure: OPEN REDUCTION INTERNAL FIXATION (ORIF) LEFT ANKLE FRACTURE;  Surgeon: Addie Cordella Hamilton, MD;  Location: [facility] OR;  Service: Orthopedics;  Laterality: Left;   Family History: History reviewed. No pertinent family history.  Family Psychiatric  History: See H&P  Social History:  Social History   Substance and Sexual Activity  Alcohol Use Yes     Social History   Substance and Sexual Activity  Drug Use Not Currently   Types: Marijuana    Social History   Socioeconomic History   Marital status: Divorced    Spouse name: Not on file   Number of children: Not on file   Years of education: Not on file   Highest education level: Not on file  Occupational History   Not on file  Tobacco Use   Smoking status: Some Days    Types: Cigarettes   Smokeless tobacco: Never  Vaping Use   Vaping status: Never Used  Substance and Sexual Activity   Alcohol use: Yes   Drug use: Not Currently    Types:  Marijuana   Sexual activity: Yes    Birth control/protection: None  Other Topics Concern   Not on file  Social History Narrative   Not on file   Social Drivers of Health   Financial Resource Strain: Not on file  Food Insecurity: No Food Insecurity (08/16/2023)   Hunger Vital Sign    Worried About Running Out of Food in the Last Year: Never true    Ran Out of Food in the Last Year: Never true  Transportation Needs: No Transportation Needs (08/16/2023)   PRAPARE - Administrator, Civil Service (Medical): No    Lack of Transportation (Non-Medical): No  Physical Activity: Not on file  Stress: Not on file  Social Connections: Unknown (05/30/2021)   Received from [facility]   Social Network    Social Network: Not on file   Hospital Course: (Per admission evaluation notes): 48 year old female with a psychiatric history significant of alcohol use disorder, GAD, and adjustment disorder.  She was admitted to the [facility] following voluntary presentation to [facility], reporting worsening depression and suicidal ideation, including a plan to overdose.  These symptoms reportedly emerged in the context of a recent relapse into alcohol use.   Upon the decision by her treatment team to discharge Veronica Le today, she was seen & evaluated  by her treatment team for mood stability. The current laboratory findings were  reviewed, stable. The nurses notes & vital signs were reviewed as well. All are stable. At this present time, there are no current mental health or medical issues that should prevent this discharge at this time. Patient is being discharged to continue mental health care & medication management as noted below. She is also aware & agreeable to this discharge.  Although with what seems like an extensive hx of mental health issues, substance use disorder & probable previous psychiatric hospitalizations/treatments, this is Veronica Le's  [facility] psychiatric admission[facility]discharge summary [facility]. She was admitted with complaint of worsening depression & suicidal ideation, including a plan to overdose on some medications. She was recommended for mood stabilization treatments by her treatment team after her admission evaluation. However, Veronica Le refused to take the medications recommended for mood stabilization. She was also enrolled & participated in the group counseling sessions being offered & held on this unit. She learned coping skills. She presented no other significant pre-existing medical conditions that required treatment or monitoring. Veronica Le's symptoms has subsided. She is currently mentally & medically stable to be discharged to continue further mental health care/medication management as noted below. She also declined rehabilitation treatment program referral for her alcohol use disorder. She is agreeable that this discharge is warranted.   During the course of her hospitalization, the 15-minute checks were adequate to ensure Veronica Le's safety. Patient did not display any dangerous, violent or suicidal behavior on the unit.  She interacted with the other patients & staff appropriately. She participated appropriately in the group sessions/therapies. Her medications were addressed & adjusted to meet her needs. She was recommended for outpatient follow-up care & medication management upon discharge to assure her continuity of care.  At the time of discharge, patient is not reporting any acute suicidal/homicidal ideations. She currently denies any new issues or concerns. Education and supportive counseling provided throughout her hospital stay & upon discharge.   Today upon her discharge evaluation with her treatment team, Veronica Le shares she is doing well. She denies any other specific concerns. She is sleeping well. Her appetite is good. She denies other physical complaints. She denies AH/VH, delusional thoughts or paranoia. She does  not appear to be responding to any internal stimuli. She was able to engage in safety planning including plan to return to [facility] or contact emergency services if she feels unable to maintain her own safety or the safety of others. Pt had no further questions, comments, or concerns. She left [facility] with all personal belongings in no apparent distress. Transportation per her taxi. [facility] assisted with taxi fare.  Physical Findings: AIMS:  , ,  ,  ,  ,  ,   CIWA:  CIWA-Ar Total: 0 COWS:     Musculoskeletal: Strength & Muscle Tone: within normal limits Gait & Station: normal Patient leans: N/A  Psychiatric Specialty Exam:  Presentation  General Appearance:  Appropriate for Environment; Casual; Fairly Groomed  Eye Contact: Good  Speech: Clear and Coherent; Normal Rate  Speech Volume: Normal  Handedness: Right   Mood and Affect  Mood: Euthymic  Affect: Appropriate; Congruent  Thought Process  Thought Processes: Coherent; Goal Directed; Linear  Descriptions of Associations:Intact  Orientation:Full (Time, Place and Person)  Thought Content:Logical  History of Schizophrenia/Schizoaffective disorder:No  Duration of Psychotic Symptoms: NA. Hallucinations:Hallucinations: None  Ideas of Reference:None  Suicidal Thoughts:Suicidal Thoughts: No  Homicidal Thoughts:Homicidal Thoughts: No   Sensorium  Memory: Immediate Good; Recent Good; Remote Good  Judgment: Poor  Insight: Present  Executive Functions  Concentration:  Good  Attention Span: Good  Recall: Good  Fund of Knowledge: Fair  Language: Good  Psychomotor Activity  Psychomotor Activity:Psychomotor Activity: Normal   Assets  Assets: Communication Skills; Desire for Improvement; Financial Resources/Insurance; Housing; Physical Health; Resilience; Social Support  Sleep  Sleep:Sleep: Good  Estimated Sleeping Duration (Last 24 Hours): 7.00-7.50 hours  Physical Exam: Physical Exam Vitals  and nursing note reviewed.  HENT:     Head: Normocephalic.     Nose: Nose normal.     Mouth/Throat:     Pharynx: Oropharynx is clear.  Cardiovascular:     Rate and Rhythm: Normal rate.     Pulses: Normal pulses.  Genitourinary:    Comments: Deferred Musculoskeletal:        General: Normal range of motion.     Cervical back: Normal range of motion.  Skin:    General: Skin is dry.  Neurological:     General: No focal deficit present.     Mental Status: She is alert and oriented to person, place, and time. Mental status is at baseline.    Review of Systems  Constitutional:  Negative for chills, diaphoresis and fever.  HENT:  Negative for congestion and sore throat.   Eyes:  Negative for blurred vision.  Respiratory:  Negative for cough, shortness of breath and wheezing.   Cardiovascular:  Negative for chest pain and palpitations.  Gastrointestinal:  Negative for abdominal pain, constipation, diarrhea, heartburn, nausea and vomiting.  Genitourinary:  Negative for dysuria.  Musculoskeletal:  Negative for joint pain and myalgias.  Neurological:  Negative for dizziness, tingling, tremors, sensory change, speech change, focal weakness, seizures, loss of consciousness, weakness and headaches.  Endo/Heme/Allergies:        NKDA.  Psychiatric/Behavioral:  Positive for depression (Hx of (stable on medication).) and substance abuse. Negative for hallucinations, memory loss and suicidal ideas (Hx. alcohol use disorder.). The patient is not nervous/anxious (Stable upon discharge.) and does not have insomnia.    Blood pressure 139/71, pulse 68, temperature 99.1 F (37.3 C), resp. rate 20, height 5' 3 (1.6 m), weight 107 kg, unknown if currently breastfeeding. Body mass index is 41.81 kg/m.   Social History   Tobacco Use  Smoking Status Some Days   Types: Cigarettes  Smokeless Tobacco Never   Tobacco Cessation:  N/A, patient does not currently use tobacco products  Blood Alcohol  level:  Lab Results  Component Value Date   ETH 56 (H) 08/16/2023   ETH 223 (H) 05/02/2023   Metabolic Disorder Labs:  Lab Results  Component Value Date   HGBA1C 5.5 08/17/2023   MPG 111 08/17/2023   No results found for: PROLACTIN Lab Results  Component Value Date   CHOL 114 08/16/2023   TRIG 98 08/16/2023   HDL 40 (L) 08/16/2023   CHOLHDL 2.9 08/16/2023   VLDL 20 08/16/2023   LDLCALC 54 08/16/2023   LDLCALC 91 11/20/2021   See Psychiatric Specialty Exam and Suicide Risk Assessment completed by Attending Physician prior to discharge.  Discharge destination:  Other:  IR[facility]  Is patient on multiple antipsychotic therapies at discharge:  No   Has Patient had three or more failed trials of antipsychotic monotherapy by history:  No  Recommended Plan for Multiple Antipsychotic Therapies: NA  Allergies as of 08/19/2023   No Known Allergies      Medication List     TAKE these medications      Indication  ARIPiprazole  5 MG tablet Commonly known as: ABILIFY  Take 1 tablet (  5 mg total) by mouth daily. For mood stabilization.  Indication: Mood stabilization.   hydrOXYzine  25 MG tablet Commonly known as: ATARAX  Take 1 tablet (25 mg total) by mouth every 6 (six) hours as needed. For anxiety  Indication: Feeling Anxious   traZODone  50 MG tablet Commonly known as: DESYREL  Take 1 tablet (50 mg total) by mouth at bedtime as needed for sleep.  Indication: Trouble Sleeping        Follow-up Information     [facility]. Go on 08/31/2023.   Specialty: Behavioral Health Why: Please go to this provider on 08/31/23 at 7:00 am for an assessment, to receive therapy and medication management services. You may also go Monday through Friday, arrive by 7:00 am. Contact information: [address] [place]  [address] [phone]        [place] Go on 08/24/2023.   Specialty: Professional Counselor Why: Please go to  this provider on 08/24/23 at 9:00 am for an assessment, to receive therapy services. You may also go Monday through Friday, 9 am to 1 pm. Contact information: [address] [place]  [address] [place] [address] [phone]                Plan Of Care/Follow-up recommendations:  Activity: as tolerated Diet: heart healthy  Other: -Follow-up with your outpatient psychiatric provider -instructions on appointment date, time, and address (location) are provided to you in discharge paperwork.  -Take your psychiatric medications as prescribed at discharge - instructions are provided to you in the discharge paperwork  -Follow-up with outpatient primary care doctor and other specialists -for management of preventative medicine and chronic medical issues  -Testing: Follow-up with outpatient provider for abnormal lab results: NA  -If you are prescribed an atypical antipsychotic medication, we recommend that your outpatient psychiatrist follow routine screening for side effects within 3 months of discharge, including monitoring: AIMS scale, height, weight, blood pressure, fasting lipid panel, HbA1c, and fasting blood sugar.   -Recommend total abstinence from alcohol, tobacco, and other illicit drug use at discharge.   -If your psychiatric symptoms recur, worsen, or if you have side effects to your psychiatric medications, call your outpatient psychiatric provider, 911, [phone] or go to the nearest emergency department.  -If suicidal thoughts occur, immediately call your outpatient psychiatric provider, 911, [phone] or go to the nearest emergency department. Signed: Mac Bolster, NP, pmhnp, fnp-bc. 08/19/2023, 10:13 AM

## 2023-08-19 NOTE — [facility] Suicide Risk Assessment (Signed)
 Suicide Risk Assessment  Discharge Assessment    [facility] Discharge Suicide Risk Assessment   Principal Problem: Suicidal ideation  Discharge Diagnoses: Principal Problem:   Suicidal ideation Active Problems:   MDD (major depressive disorder), recurrent episode, severe (HCC)  Total Time spent with patient: 45 minutes  Musculoskeletal: Strength & Muscle Tone: within normal limits Gait & Station: normal Patient leans: N/A  Psychiatric Specialty Exam  Presentation  General Appearance:  Appropriate for Environment; Casual; Fairly Groomed  Eye Contact: Good  Speech: Clear and Coherent; Normal Rate  Speech Volume: Normal  Handedness: Right   Mood and Affect  Mood: Euthymic  Duration of Depression Symptoms: Greater than two weeks  Affect: Appropriate; Congruent  Thought Process  Thought Processes: Coherent; Goal Directed; Linear  Descriptions of Associations:Intact  Orientation:Full (Time, Place and Person)  Thought Content:Logical  History of Schizophrenia/Schizoaffective disorder:No  Duration of Psychotic Symptoms: NA  Hallucinations:Hallucinations: None  Ideas of Reference:None  Suicidal Thoughts:Suicidal Thoughts: No  Homicidal Thoughts:Homicidal Thoughts: No  Sensorium  Memory: Immediate Good; Recent Good; Remote Good  Judgment: Poor  Insight: Present  Executive Functions  Concentration: Good  Attention Span: Good  Recall: Good  Fund of Knowledge: Fair  Language: Good   Psychomotor Activity  Psychomotor Activity:Psychomotor Activity: Normal   Assets  Assets: Communication Skills; Desire for Improvement; Financial Resources/Insurance; Housing; Physical Health; Resilience; Social Support   Sleep  Sleep:Sleep: Good  Estimated Sleeping Duration (Last 24 Hours): 7.00-7.50 hours  Physical/Ros Exam: See the discharge summary.  Blood pressure 139/71, pulse 68, temperature 99.1 F (37.3 C), resp. rate 20, height 5' 3  (1.6 m), weight 107 kg, unknown if currently breastfeeding. Body mass index is 41.81 kg/m.  Mental Status Per Nursing Assessment::   On Admission:  Suicidal ideation indicated by patient  Demographic Factors:  Adolescent or young adult and Low socioeconomic status  Loss Factors: NA  Historical Factors: Impulsivity  Risk Reduction Factors:   Responsible for children under 56 years of age, Sense of responsibility to family, Living with another person, especially a relative, Positive social support, Positive therapeutic relationship, and Positive coping skills or problem solving skills  Continued Clinical Symptoms:  Depression:   Impulsivity Alcohol/Substance Abuse/Dependencies More than one psychiatric diagnosis  Cognitive Features That Contribute To Risk:  Thought constriction (tunnel vision)    Suicide Risk:  Minimal: No identifiable suicidal ideation.  Patients presenting with no risk factors but with morbid ruminations; may be classified as minimal risk based on the severity of the depressive symptoms   Follow-up Information     [facility]. Go on 08/31/2023.   Specialty: Behavioral Health Why: Please go to this provider on 08/31/23 at 7:00 am for an assessment, to receive therapy and medication management services. You may also go Monday through Friday, arrive by 7:00 am. Contact information: [address] [place]  [address] [phone]        [place] Go on 08/24/2023.   Specialty: Professional Counselor Why: Please go to this provider on 08/24/23 at 9:00 am for an assessment, to receive therapy services. You may also go Monday through Friday, 9 am to 1 pm. Contact information: [address] [place]  [address] [place] [address] [phone]                Plan Of Care/Follow-up recommendations:  See the discharge recommendation above.  Mac Bolster, NP, pmhnp, fnp-bc. 08/19/2023, 10:16 AM

## 2023-08-19 NOTE — Progress Notes (Signed)
  [facility] Adult Case Management Discharge Plan :  Will you be returning to the same living situation after discharge:  No. At discharge, do you have transportation home?: Yes,  CSW arranged [employer] taxi to the [facility] for 1200PM Do you have the ability to pay for your medications: No. Samples requested at discharge to pharmacy  Release of information consent forms completed and in the chart;  Patient's signature needed at discharge.  Patient to Follow up at:  Follow-up Information     [facility]. Go on 08/31/2023.   Specialty: Behavioral Health Why: Please go to this provider on 08/31/23 at 7:00 am for an assessment, to receive therapy and medication management services. You may also go Monday through Friday, arrive by 7:00 am. Contact information: [address] [place]  [address] [phone]        [place] Go on 08/24/2023.   Specialty: Professional Counselor Why: Please go to this provider on 08/24/23 at 9:00 am for an assessment, to receive therapy services. You may also go Monday through Friday, 9 am to 1 pm. Contact information: [address] [place]  [address] [place] [address] [phone]                 Next level of care provider has access to [facility] Link:no  Safety Planning and Suicide Prevention discussed: Yes,  Franky, (God brother) [phone]    Has patient been referred to the Quitline?: Patient refused referral for treatment  Patient has been referred for addiction treatment: Patient refused referral for treatment. Pt plans to return to [facility] after 14-day relapse return policy.  [name], LCSWA 08/19/2023, 9:53 AM

## 2023-08-19 NOTE — [facility] Group Notes (Signed)
 Adult Psychoeducational Group Note  Date:  08/19/2023 Time:  9:58 AM  Group Topic/Focus:  Goals Group:   The focus of this group is to help patients establish daily goals to achieve during treatment and discuss how the patient can incorporate goal setting into their daily lives to aide in recovery. Orientation:   The focus of this group is to educate the patient on the purpose and policies of crisis stabilization and provide a format to answer questions about their admission.  The group details unit policies and expectations of patients while admitted.  Participation Level:  Active  Participation Quality:  Attentive  Affect:  Appropriate  Cognitive:  Alert  Insight: Appropriate  Engagement in Group:  Engaged  Modes of Intervention:  Discussion  Additional Comments:  Patient attended and participated in the goals group.  Veronica Le 08/19/2023, 9:58 AM

## 2023-08-19 NOTE — Transportation (Signed)
 08/19/2023  Veronica Le DOB: Jun 23, 1975 MRN: [id number]   RIDER WAIVER AND RELEASE OF LIABILITY  For the purposes of helping with transportation needs, [facility] partners with outside transportation providers (taxi companies, [place], Catering manager.) to give [name] patients or other approved people the choice of on-demand rides Public librarian) to our buildings for non-emergency visits.  By using [employer], I, the person signing this document, on behalf of myself and/or any legal minors (in my care using the [employer]), agree:  Science writer given to me are supplied by independent, outside transportation providers who do not work for, or have any affiliation with, [name]. [place] is not a transportation company. [place] has no control over the quality or safety of the rides I get using [employer]. [employer] has no control over whether any outside ride will happen on time or not. [facility] gives no guarantee on the reliability, quality, safety, or availability on any rides, or that no mistakes will happen. I know and accept that traveling by vehicle (car, truck, SVU, fleeta, bus, taxi, etc.) has risks of serious injuries such as disability, being paralyzed, and death. I know and agree the risk of using [employer] is mine alone, and not [employer]. [employer] are provided as is and as are available. The transportation providers are in charge for all inspections and care of the vehicles used to provide these rides. I agree not to take legal action against [facility] [facility] [facility], its agents, employees, officers, directors, representatives, insurers, attorneys, assigns, successors, subsidiaries, and affiliates at any time for any reasons related directly or indirectly to using [employer]. I also agree not to take legal action against [employer] or its affiliates for any injury, death, or damage to property caused by or related to using  [employer]. I have read this Waiver and Release of Liability, and I understand the terms used in it and their legal meaning. This Waiver is freely and voluntarily given with the understanding that my right (or any legal minors) to legal action against [place] relating to [employer] is knowingly given up to use these services.   I attest that I read the Ride Waiver and Release of Liability to [name], gave [name] the opportunity to ask questions and answered the questions asked (if any). I affirm that [name] then provided consent for assistance with transportation.

## 2023-08-19 NOTE — Plan of Care (Signed)
  Problem: Education: Goal: Emotional status will improve Outcome: Progressing Goal: Mental status will improve Outcome: Progressing Goal: Verbalization of understanding the information provided will improve Outcome: Progressing   Problem: Activity: Goal: Interest or engagement in activities will improve Outcome: Progressing Goal: Sleeping patterns will improve Outcome: Progressing   Problem: Coping: Goal: Ability to verbalize frustrations and anger appropriately will improve Outcome: Progressing Goal: Ability to demonstrate self-control will improve Outcome: Progressing   Problem: Health Behavior/Discharge Planning: Goal: Identification of resources available to assist in meeting health care needs will improve Outcome: Progressing Goal: Compliance with treatment plan for underlying cause of condition will improve Outcome: Progressing   Problem: Physical Regulation: Goal: Ability to maintain clinical measurements within normal limits will improve Outcome: Progressing   Problem: Safety: Goal: Periods of time without injury will increase Outcome: Progressing

## 2023-08-19 NOTE — Progress Notes (Signed)
 Pt discharged to lobby. Pt was stable and appreciative at that time. All papers, samples and prescriptions were given and valuables returned. Verbal understanding expressed. Denies SI/HI and A/VH. Pt given opportunity to express concerns and ask questions.

## 2023-08-19 NOTE — [facility] Suicide Risk Assessment (Signed)
 [facility] INPATIENT:  Family/Significant Other Suicide Prevention Education  Suicide Prevention Education:  Education Completed; Veronica Le, (God brother) [phone],  (name of family member/significant other) has been identified by the patient as the family member/significant other with whom the patient will be residing, and identified as the person(s) who will aid the patient in the event of a mental health crisis (suicidal ideations/suicide attempt).  With written consent from the patient, the family member/significant other has been provided the following suicide prevention education, prior to the and/or following the discharge of the patient.  Does not have access to weapons or firearms to Veronica Le's knowledge. Keeps in contact with patient frequently, able to provide mental health support post discharge.  The suicide prevention education provided includes the following: Suicide risk factors Suicide prevention and interventions National Suicide Hotline telephone number [facility] assessment telephone number [employer] [facility] [employer] Emergency Assistance 911 [facility] and/or Residential Mobile Crisis Unit telephone number  Request made of family/significant other to: Remove weapons (e.g., guns, rifles, knives), all items previously/currently identified as safety concern.   Remove drugs/medications (over-the-counter, prescriptions, illicit drugs), all items previously/currently identified as a safety concern.  The family member/significant other verbalizes understanding of the suicide prevention education information provided.  The family member/significant other agrees to remove the items of safety concern listed above.  Veronica Le 08/19/2023, 9:51 AM

## 2023-08-23 ENCOUNTER — Other Ambulatory Visit (HOSPITAL_COMMUNITY): Payer: Self-pay

## 2023-08-28 ENCOUNTER — Other Ambulatory Visit (HOSPITAL_COMMUNITY): Payer: Self-pay
